# Patient Record
Sex: Female | Born: 1995 | Race: White | Hispanic: No | Marital: Married | Smoking: Former smoker
Health system: Southern US, Community
[De-identification: ages and names within clinical notes are randomized; demographics above are authoritative.]

## PROBLEM LIST (undated history)

## (undated) DIAGNOSIS — F410 Panic disorder [episodic paroxysmal anxiety] without agoraphobia: Secondary | ICD-10-CM

## (undated) DIAGNOSIS — F32A Depression, unspecified: Secondary | ICD-10-CM

## (undated) DIAGNOSIS — F419 Anxiety disorder, unspecified: Secondary | ICD-10-CM

## (undated) DIAGNOSIS — E282 Polycystic ovarian syndrome: Secondary | ICD-10-CM

## (undated) DIAGNOSIS — K219 Gastro-esophageal reflux disease without esophagitis: Secondary | ICD-10-CM

## (undated) HISTORY — DX: Panic disorder (episodic paroxysmal anxiety): F41.0

## (undated) HISTORY — PX: TONSILLECTOMY: SUR1361

## (undated) HISTORY — PX: APPENDECTOMY: SHX54

## (undated) HISTORY — DX: Polycystic ovarian syndrome: E28.2

## (undated) HISTORY — PX: ADENOIDECTOMY: SUR15

## (undated) HISTORY — DX: Gastro-esophageal reflux disease without esophagitis: K21.9

## (undated) HISTORY — DX: Depression, unspecified: F32.A

---
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
## 2017-02-10 ENCOUNTER — Ambulatory Visit: Payer: Medicaid Other | Admitting: Pediatrics

## 2017-02-23 ENCOUNTER — Other Ambulatory Visit: Payer: Self-pay | Admitting: Pediatrics

## 2017-02-23 ENCOUNTER — Other Ambulatory Visit: Payer: Self-pay | Admitting: Family Medicine

## 2017-02-23 ENCOUNTER — Ambulatory Visit (INDEPENDENT_AMBULATORY_CARE_PROVIDER_SITE_OTHER): Payer: Self-pay | Admitting: Pediatrics

## 2017-02-23 VITALS — BP 131/84 | HR 89 | Temp 98.0°F | Ht 62.01 in | Wt 225.0 lb

## 2017-02-23 DIAGNOSIS — L680 Hirsutism: Secondary | ICD-10-CM

## 2017-02-23 DIAGNOSIS — Z3009 Encounter for other general counseling and advice on contraception: Secondary | ICD-10-CM

## 2017-02-23 DIAGNOSIS — F32A Depression, unspecified: Secondary | ICD-10-CM

## 2017-02-23 DIAGNOSIS — F329 Major depressive disorder, single episode, unspecified: Secondary | ICD-10-CM

## 2017-02-23 DIAGNOSIS — J309 Allergic rhinitis, unspecified: Secondary | ICD-10-CM

## 2017-02-23 DIAGNOSIS — F419 Anxiety disorder, unspecified: Secondary | ICD-10-CM

## 2017-02-23 DIAGNOSIS — J4530 Mild persistent asthma, uncomplicated: Secondary | ICD-10-CM

## 2017-02-23 LAB — PREGNANCY, URINE: PREG TEST UR: NEGATIVE

## 2017-02-23 MED ORDER — FLUOXETINE HCL 20 MG PO CAPS: 20.0000 mg | ORAL_CAPSULE | Freq: Every day | ORAL | 2 refills | Status: DC

## 2017-02-23 MED ORDER — NORGESTIMATE-ETH ESTRADIOL 0.25-35 MG-MCG PO TABS: 1.0000 | ORAL_TABLET | Freq: Every day | ORAL | 3 refills | Status: DC

## 2017-02-23 NOTE — Patient Instructions (Addendum)
[facility]  [address] [place], [place] [address] (ph) [phone]  [address], [address] [place], [place] [address] (ph) [phone]  
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
## 2017-03-18 ENCOUNTER — Ambulatory Visit: Payer: Medicaid Other | Admitting: Pediatrics

## 2017-03-19 ENCOUNTER — Telehealth: Payer: Self-pay | Admitting: Pediatrics

## 2017-03-19 ENCOUNTER — Encounter: Payer: Self-pay | Admitting: Pediatrics

## 2017-05-18 ENCOUNTER — Encounter: Payer: Self-pay | Admitting: Pediatrics

## 2017-05-18 ENCOUNTER — Ambulatory Visit (INDEPENDENT_AMBULATORY_CARE_PROVIDER_SITE_OTHER): Payer: Self-pay | Admitting: Pediatrics

## 2017-05-18 DIAGNOSIS — L680 Hirsutism: Secondary | ICD-10-CM

## 2017-05-18 DIAGNOSIS — F329 Major depressive disorder, single episode, unspecified: Secondary | ICD-10-CM

## 2017-05-18 DIAGNOSIS — Z3009 Encounter for other general counseling and advice on contraception: Secondary | ICD-10-CM

## 2017-05-18 DIAGNOSIS — F32A Depression, unspecified: Secondary | ICD-10-CM

## 2017-05-18 DIAGNOSIS — F419 Anxiety disorder, unspecified: Secondary | ICD-10-CM

## 2017-05-18 DIAGNOSIS — J302 Other seasonal allergic rhinitis: Secondary | ICD-10-CM

## 2017-05-18 DIAGNOSIS — J4530 Mild persistent asthma, uncomplicated: Secondary | ICD-10-CM

## 2017-05-18 MED ORDER — CETIRIZINE HCL 10 MG PO TABS: 10.0000 mg | ORAL_TABLET | Freq: Every day | ORAL | 5 refills | Status: DC

## 2017-05-18 MED ORDER — SPIRONOLACTONE 50 MG PO TABS: 50.0000 mg | ORAL_TABLET | Freq: Two times a day (BID) | ORAL | 5 refills | Status: DC

## 2017-05-18 MED ORDER — ALBUTEROL SULFATE (5 MG/ML) 0.5% IN NEBU: 2.5000 mg | INHALATION_SOLUTION | Freq: Four times a day (QID) | RESPIRATORY_TRACT | 2 refills | Status: DC | PRN

## 2017-05-18 MED ORDER — FLUTICASONE PROPIONATE 50 MCG/ACT NA SUSP: 2.0000 | Freq: Every day | NASAL | 2 refills | Status: DC

## 2017-05-18 MED ORDER — FLUOXETINE HCL 40 MG PO CAPS: 40.0000 mg | ORAL_CAPSULE | Freq: Every day | ORAL | 5 refills | Status: DC

## 2017-05-18 MED ORDER — NORGESTIMATE-ETH ESTRADIOL 0.25-35 MG-MCG PO TABS: 1.0000 | ORAL_TABLET | Freq: Every day | ORAL | 3 refills | Status: DC

## 2017-05-18 NOTE — Patient Instructions (Signed)
Your provider wants you to schedule an appointment with a Psychologist/Psychiatrist. The following list of offices requires the patient to call and make their own appointment, as there is information they need that only you can provide. Please feel free to choose form the following providers:  [facility]   [phone] Crisis Recovery in [place] [phone]  [facility]  [phone]   [address] [place], [place]  (Scheduled through [facility]) Must call and do an interview for appointment. Sees Children / Accepts Medicaid  [facility]    [phone]  [address], [address]    [place], [place]       [facility]  [phone] [address] [place], [place]  Evaluates for Autism but does not treat it Sees Children / Accepts Medicaid  [facility]    [phone] [address], [address]   [place], [place] Medication management, substance abuse, bipolar, grief, family, marriage, OCD, anxiety, PTSD Sees children / Accepts Medicaid  [facility]    [phone] [address] [place], [place] Sees children / Accepts Medicaid  [facility]  [phone] [address] [place], [place]   Dr Akinlayo     [phone] [address] [place], [place]  Sees ADD & ADHD for treatment Accepts Medicaid  [facility]  [phone] [address] [place], [place] Evaluates for Autism Accepts Medicaid   [facility]   [phone] [address]   [place], [place] Uses animal therapy  Sees children as young as 3 years old Accepts Medicaid  [facility]     [phone]    [address]  [place], [place] [address] Sees children Accepts Medicaid   
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
## 2017-05-19 LAB — BMP8+EGFR
BUN / CREAT RATIO: 14 (ref 9–23)
BUN: 9 mg/dL (ref 6–20)
CO2: 22 mmol/L (ref 20–29)
CREATININE: 0.63 mg/dL (ref 0.57–1.00)
Calcium: 9 mg/dL (ref 8.7–10.2)
Chloride: 104 mmol/L (ref 96–106)
GFR, EST AFRICAN AMERICAN: 150 mL/min/{1.73_m2} (ref >59)
GFR, EST NON AFRICAN AMERICAN: 130 mL/min/{1.73_m2} (ref >59)
Glucose: 101 mg/dL — ABNORMAL HIGH (ref 65–99)
POTASSIUM: 4.1 mmol/L (ref 3.5–5.2)
SODIUM: 143 mmol/L (ref 134–144)

## 2017-07-06 ENCOUNTER — Encounter (HOSPITAL_COMMUNITY): Payer: Self-pay | Admitting: *Deleted

## 2017-07-06 ENCOUNTER — Emergency Department (HOSPITAL_COMMUNITY)
Admission: EM | Admit: 2017-07-06 | Discharge: 2017-07-06 | Disposition: A | Discharge status: Home / Self Care | Payer: Self-pay | Attending: Emergency Medicine | Admitting: Emergency Medicine

## 2017-07-06 DIAGNOSIS — J45909 Unspecified asthma, uncomplicated: Secondary | ICD-10-CM | POA: Insufficient documentation

## 2017-07-06 DIAGNOSIS — Z79899 Other long term (current) drug therapy: Secondary | ICD-10-CM | POA: Insufficient documentation

## 2017-07-06 DIAGNOSIS — F17210 Nicotine dependence, cigarettes, uncomplicated: Secondary | ICD-10-CM | POA: Insufficient documentation

## 2017-07-06 DIAGNOSIS — R110 Nausea: Secondary | ICD-10-CM | POA: Insufficient documentation

## 2017-07-06 HISTORY — DX: Anxiety disorder, unspecified: F41.9

## 2017-07-06 LAB — CBC
HCT: 38.9 % (ref 36.0–46.0)
HEMOGLOBIN: 12.7 g/dL (ref 12.0–15.0)
MCH: 28.5 pg (ref 26.0–34.0)
MCHC: 32.6 g/dL (ref 30.0–36.0)
MCV: 87.4 fL (ref 78.0–100.0)
Platelets: 340 10*3/uL (ref 150–400)
RBC: 4.45 MIL/uL (ref 3.87–5.11)
RDW: 13.4 % (ref 11.5–15.5)
WBC: 10.7 10*3/uL — ABNORMAL HIGH (ref 4.0–10.5)

## 2017-07-06 LAB — CBG MONITORING, ED: Glucose-Capillary: 125 mg/dL — ABNORMAL HIGH (ref 65–99)

## 2017-07-06 LAB — BASIC METABOLIC PANEL
Anion gap: 8 (ref 5–15)
BUN: 8 mg/dL (ref 6–20)
CHLORIDE: 103 mmol/L (ref 101–111)
CO2: 25 mmol/L (ref 22–32)
CREATININE: 0.7 mg/dL (ref 0.44–1.00)
Calcium: 8.6 mg/dL — ABNORMAL LOW (ref 8.9–10.3)
GFR calc Af Amer: >60 mL/min (ref >60)
GFR calc non Af Amer: >60 mL/min (ref >60)
GLUCOSE: 132 mg/dL — AB (ref 65–99)
Potassium: 3.2 mmol/L — ABNORMAL LOW (ref 3.5–5.1)
SODIUM: 136 mmol/L (ref 135–145)

## 2017-07-06 LAB — PREGNANCY, URINE: PREG TEST UR: NEGATIVE

## 2017-07-06 NOTE — ED Provider Notes (Signed)
[facility] EMERGENCY DEPARTMENT Provider Note   CSN: [id number] Arrival date & time: 07/06/17  4098     History   Chief Complaint Chief Complaint  Patient presents with  . Nausea    HPI Zoe Miller is a 21 y.o. female.  HPI For evaluation of episodes of feeling nauseated and sweaty and feeling like her blood sugar was low.  Patient does not have diabetes but family members do.  She checked her blood sugar and it was 69.  She had a milkshake and her sugar increased but then she felt like it dropped again.  She denies any abdominal pain.  No vomiting or diarrhea.  No fevers or chills.  Patient is not having the symptoms right now. Past Medical History:  Diagnosis Date  . Anxiety   . Asthma     Patient Active Problem List   Diagnosis Date Noted  . Depression 11/05/2016  . Anxiety 11/05/2016  . Elevated blood pressure reading 06/19/2016  . Birth control counseling 06/19/2016  . Anxiety and depression 05/21/2016  . Lymphadenopathy 08/17/2015  . BMI 40.0-44.9, adult (HCC) 08/17/2015  . Asthma, mild persistent 08/17/2015  . Rhinitis, allergic 08/17/2015  . Hirsutism 08/17/2015  . PCOS (polycystic ovarian syndrome) 08/17/2015    Past Surgical History:  Procedure Laterality Date  . APPENDECTOMY    . TONSILLECTOMY      OB History    No data available       Home Medications    Prior to Admission medications   Medication Sig Start Date End Date Taking? Authorizing Provider  albuterol (PROVENTIL) (5 MG/ML) 0.5% nebulizer solution Take 0.5 mLs (2.5 mg total) by nebulization every 6 (six) hours as needed. 05/18/17  Yes Johna Sheriff, MD  cetirizine (ZYRTEC) 10 MG tablet Take 1 tablet (10 mg total) by mouth daily. 05/18/17  Yes Johna Sheriff, MD  FLUoxetine (PROZAC) 20 MG capsule Take 20 mg by mouth daily.   Yes [provider]  norgestimate-ethinyl estradiol (ORTHO-CYCLEN,SPRINTEC,PREVIFEM) 0.25-35 MG-MCG tablet Take 1 tablet by mouth daily. 05/18/17   Yes Johna Sheriff, MD    Family History Family History  Problem Relation Age of Onset  . Diabetes Mother     Social History Social History  Substance Use Topics  . Smoking status: Current Every Day Smoker    Packs/day: 0.50    Types: Cigarettes  . Smokeless tobacco: Never Used  . Alcohol use No     Allergies   Lactose intolerance (gi) and Chocolate   Review of Systems Review of Systems  All other systems reviewed and are negative.    Physical Exam Updated Vital Signs BP 134/81   Pulse 83   Temp 97.6 F (36.4 C) (Oral)   Resp 16   Ht 1.575 m (5\' 2" )   Wt 103.6 kg (228 lb 6.4 oz)   LMP  (Within Weeks)   SpO2 100%   BMI 41.77 kg/m   Physical Exam  Constitutional: She appears well-developed and well-nourished. No distress.  HENT:  Head: Normocephalic and atraumatic.  Right Ear: External ear normal.  Left Ear: External ear normal.  Eyes: Conjunctivae are normal. Right eye exhibits no discharge. Left eye exhibits no discharge. No scleral icterus.  Neck: Neck supple. No tracheal deviation present.  Cardiovascular: Normal rate, regular rhythm and intact distal pulses.   Pulmonary/Chest: Effort normal and breath sounds normal. No stridor. No respiratory distress. She has no wheezes. She has no rales.  Abdominal: Soft. Bowel sounds are  normal. She exhibits no distension. There is no tenderness. There is no rebound and no guarding.  Musculoskeletal: She exhibits no edema or tenderness.  Neurological: She is alert. She has normal strength. No cranial nerve deficit (no facial droop, extraocular movements intact, no slurred speech) or sensory deficit. She exhibits normal muscle tone. She displays no seizure activity. Coordination normal.  Skin: Skin is warm and dry. No rash noted.  Psychiatric: She has a normal mood and affect.  Nursing note and vitals reviewed.    ED Treatments / Results  Labs (all labs ordered are listed, but only abnormal results are  displayed) Labs Reviewed  CBC - Abnormal; Notable for the following:       Result Value   WBC 10.7 (*)    All other components within normal limits  BASIC METABOLIC PANEL - Abnormal; Notable for the following:    Potassium 3.2 (*)    Glucose, Bld 132 (*)    Calcium 8.6 (*)    All other components within normal limits  CBG MONITORING, ED - Abnormal; Notable for the following:    Glucose-Capillary 125 (*)    All other components within normal limits  PREGNANCY, URINE     Radiology No results found.  Procedures Procedures (including critical care time)  Medications Ordered in ED Medications - No data to display   Initial Impression / Assessment and Plan / ED Course  I have reviewed the triage vital signs and the nursing notes.  Pertinent labs & imaging results that were available during my care of the patient were reviewed by me and considered in my medical decision making (see chart for details).   Patient's labs are reassuring.  Mild hypokalemia noted but I do not think this is clinically significant.  It is possible her symptoms may have been related to borderline blood sugars.  She may have had recurrent symptoms from drinking a milkshake with high sugar content and subsequent insulin release.  At this time there does not appear to be any evidence of an acute emergency medical condition and the patient appears stable for discharge with appropriate outpatient follow up.   Final Clinical Impressions(s) / ED Diagnoses   Final diagnoses:  Nausea    New Prescriptions New Prescriptions   No medications on file     Linwood DibblesKnapp, Yamna Mackel, MD 07/06/17 1114

## 2017-07-06 NOTE — Discharge Instructions (Signed)
Make sure to eat a balanced diet as we discussed.  Follow-up with your primary care doctor if you continue to have symptoms.

## 2017-07-06 NOTE — ED Triage Notes (Signed)
Pt c/o nausea, cold sweats, low blood sugar, increased anxiety since last night. Pt reports she isn't a diabetic but checked her sugar last night after feeling "bad" and her sugar was 69. Pt reported she ate a peanut butter sandwich and her sugar increased to 146. Pt reports she takes Prozac and has been taking it as prescribed. Denies abdominal pain, vomiting, diarrhea.

## 2017-08-03 ENCOUNTER — Emergency Department (HOSPITAL_COMMUNITY)
Admission: EM | Admit: 2017-08-03 | Discharge: 2017-08-04 | Disposition: A | Discharge status: Home / Self Care | Payer: Medicaid Other | Attending: Emergency Medicine | Admitting: Emergency Medicine

## 2017-08-03 ENCOUNTER — Encounter (HOSPITAL_COMMUNITY): Payer: Self-pay | Admitting: *Deleted

## 2017-08-03 ENCOUNTER — Other Ambulatory Visit: Payer: Self-pay

## 2017-08-03 DIAGNOSIS — Z79899 Other long term (current) drug therapy: Secondary | ICD-10-CM | POA: Insufficient documentation

## 2017-08-03 DIAGNOSIS — E162 Hypoglycemia, unspecified: Secondary | ICD-10-CM | POA: Insufficient documentation

## 2017-08-03 DIAGNOSIS — J4530 Mild persistent asthma, uncomplicated: Secondary | ICD-10-CM | POA: Insufficient documentation

## 2017-08-03 DIAGNOSIS — R110 Nausea: Secondary | ICD-10-CM

## 2017-08-03 DIAGNOSIS — F17210 Nicotine dependence, cigarettes, uncomplicated: Secondary | ICD-10-CM | POA: Insufficient documentation

## 2017-08-03 LAB — CBC WITH DIFFERENTIAL/PLATELET
Basophils Absolute: 0.1 10*3/uL (ref 0.0–0.1)
Basophils Relative: 1 %
EOS PCT: 2 %
Eosinophils Absolute: 0.3 10*3/uL (ref 0.0–0.7)
HCT: 41.6 % (ref 36.0–46.0)
Hemoglobin: 13.5 g/dL (ref 12.0–15.0)
LYMPHS ABS: 3.4 10*3/uL (ref 0.7–4.0)
LYMPHS PCT: 31 %
MCH: 28.9 pg (ref 26.0–34.0)
MCHC: 32.5 g/dL (ref 30.0–36.0)
MCV: 89.1 fL (ref 78.0–100.0)
MONO ABS: 0.7 10*3/uL (ref 0.1–1.0)
MONOS PCT: 7 %
Neutro Abs: 6.5 10*3/uL (ref 1.7–7.7)
Neutrophils Relative %: 59 %
PLATELETS: 416 10*3/uL — AB (ref 150–400)
RBC: 4.67 MIL/uL (ref 3.87–5.11)
RDW: 13.5 % (ref 11.5–15.5)
WBC: 11 10*3/uL — AB (ref 4.0–10.5)

## 2017-08-03 LAB — URINALYSIS, ROUTINE W REFLEX MICROSCOPIC
Bilirubin Urine: NEGATIVE
GLUCOSE, UA: NEGATIVE mg/dL
Hgb urine dipstick: NEGATIVE
Ketones, ur: NEGATIVE mg/dL
LEUKOCYTES UA: NEGATIVE
Nitrite: NEGATIVE
PH: 5 (ref 5.0–8.0)
Protein, ur: NEGATIVE mg/dL
Specific Gravity, Urine: 1.015 (ref 1.005–1.030)

## 2017-08-03 LAB — BASIC METABOLIC PANEL
Anion gap: 8 (ref 5–15)
BUN: 10 mg/dL (ref 6–20)
CO2: 25 mmol/L (ref 22–32)
Calcium: 9.1 mg/dL (ref 8.9–10.3)
Chloride: 103 mmol/L (ref 101–111)
Creatinine, Ser: 0.74 mg/dL (ref 0.44–1.00)
GFR calc Af Amer: >60 mL/min (ref >60)
GLUCOSE: 110 mg/dL — AB (ref 65–99)
POTASSIUM: 3.5 mmol/L (ref 3.5–5.1)
Sodium: 136 mmol/L (ref 135–145)

## 2017-08-03 LAB — POC URINE PREG, ED: Preg Test, Ur: NEGATIVE

## 2017-08-03 LAB — CBG MONITORING, ED: Glucose-Capillary: 111 mg/dL — ABNORMAL HIGH (ref 65–99)

## 2017-08-03 NOTE — ED Triage Notes (Signed)
Pt reports her blood sugar this morning was 58 and this afternoon it was 78. Pt felt really weak and was sleepy so she checked her blood sugar. Pt concerned she may be diabetic.

## 2017-08-04 ENCOUNTER — Ambulatory Visit: Payer: Self-pay | Admitting: Family Medicine

## 2017-08-04 ENCOUNTER — Ambulatory Visit: Payer: Self-pay | Admitting: Nurse Practitioner

## 2017-08-04 NOTE — ED Notes (Signed)
Pt alert & oriented x4, stable gait. Patient given discharge instructions, paperwork & prescription(s). Patient  instructed to stop at the registration desk to finish any additional paperwork. Patient verbalized understanding. Pt left department w/ no further questions. 

## 2017-08-04 NOTE — ED Provider Notes (Signed)
[facility] EMERGENCY DEPARTMENT Provider Note   CSN: [id number] Arrival date & time: 08/03/17  2047     History   Chief Complaint Chief Complaint  Patient presents with  . Hypoglycemia    HPI Zoe Miller is a 21 y.o. female.   Hypoglycemia  Severity:  Mild Onset quality:  Gradual Timing:  Intermittent Progression:  Waxing and waning Chronicity:  New Diabetic status:  Non-diabetic Context: decreased oral intake   Relieved by:  Eating Ineffective treatments:  None tried Associated symptoms: dizziness and weakness   Associated symptoms: no shortness of breath and no vomiting   Dizziness:    Severity:  Mild   Timing:  Intermittent   Progression:  Waxing and waning patient presents for concern for diabetes Patient reports her blood sugar has been fluctuating for the past several days Reports it has been low as 58 and as high as 100's She Is not diabetic She has not had any new medications Reports when her blood sugar is low she feels lightheaded She denies fever/vomiting Does report recent cough  Past Medical History:  Diagnosis Date  . Anxiety   . Asthma     Patient Active Problem List   Diagnosis Date Noted  . Depression 11/05/2016  . Anxiety 11/05/2016  . Elevated blood pressure reading 06/19/2016  . Birth control counseling 06/19/2016  . Anxiety and depression 05/21/2016  . Lymphadenopathy 08/17/2015  . BMI 40.0-44.9, adult (HCC) 08/17/2015  . Asthma, mild persistent 08/17/2015  . Rhinitis, allergic 08/17/2015  . Hirsutism 08/17/2015  . PCOS (polycystic ovarian syndrome) 08/17/2015    Past Surgical History:  Procedure Laterality Date  . APPENDECTOMY    . TONSILLECTOMY      OB History    No data available       Home Medications    Prior to Admission medications   Medication Sig Start Date End Date Taking? Authorizing Provider  albuterol (PROVENTIL) (5 MG/ML) 0.5% nebulizer solution Take 0.5 mLs (2.5 mg total) by nebulization every  6 (six) hours as needed. 05/18/17  Yes Johna SheriffVincent, Carol L, MD  cetirizine (ZYRTEC) 10 MG tablet Take 1 tablet (10 mg total) by mouth daily. Patient not taking: Reported on 08/03/2017 05/18/17   Johna SheriffVincent, Carol L, MD  FLUoxetine (PROZAC) 20 MG capsule Take 20 mg by mouth daily.    [provider]  norgestimate-ethinyl estradiol (ORTHO-CYCLEN,SPRINTEC,PREVIFEM) 0.25-35 MG-MCG tablet Take 1 tablet by mouth daily. Patient not taking: Reported on 08/03/2017 05/18/17   Johna SheriffVincent, Carol L, MD    Family History Family History  Problem Relation Age of Onset  . Diabetes Mother     Social History Social History   Tobacco Use  . Smoking status: Current Every Day Smoker    Packs/day: 0.50    Types: Cigarettes  . Smokeless tobacco: Never Used  Substance Use Topics  . Alcohol use: No  . Drug use: No     Allergies   Lactose intolerance (gi) and Chocolate   Review of Systems Review of Systems  Constitutional: Negative for fever.  Respiratory: Negative for shortness of breath.   Gastrointestinal: Negative for vomiting.  Neurological: Positive for dizziness and weakness.  All other systems reviewed and are negative.    Physical Exam Updated Vital Signs BP 129/90 (BP Location: Left Arm)   Pulse 97   Temp 98.3 F (36.8 C) (Oral)   Resp 16   Ht 1.575 m (5\' 2" )   Wt 103.4 kg (228 lb)   LMP 07/08/2017  SpO2 100%   BMI 41.70 kg/m   Physical Exam CONSTITUTIONAL: Well developed/well nourished HEAD: Normocephalic/atraumatic EYES: EOMI/PERRL ENMT: Mucous membranes moist NECK: supple no meningeal signs SPINE/BACK:entire spine nontender CV: S1/S2 noted, no murmurs/rubs/gallops noted LUNGS: Lungs are clear to auscultation bilaterally, no apparent distress ABDOMEN: soft, nontender, no rebound or guarding, bowel sounds noted throughout abdomen GU:no cva tenderness NEURO: Pt is awake/alert/appropriate, moves all extremitiesx4.  No facial droop.   EXTREMITIES: pulses normal/equal,  full ROM SKIN: warm, color normal PSYCH: no abnormalities of mood noted, alert and oriented to situation   ED Treatments / Results  Labs (all labs ordered are listed, but only abnormal results are displayed) Labs Reviewed  CBC WITH DIFFERENTIAL/PLATELET - Abnormal; Notable for the following components:      Result Value   WBC 11.0 (*)    Platelets 416 (*)    All other components within normal limits  BASIC METABOLIC PANEL - Abnormal; Notable for the following components:   Glucose, Bld 110 (*)    All other components within normal limits  URINALYSIS, ROUTINE W REFLEX MICROSCOPIC - Abnormal; Notable for the following components:   APPearance HAZY (*)    All other components within normal limits  CBG MONITORING, ED - Abnormal; Notable for the following components:   Glucose-Capillary 111 (*)    All other components within normal limits  POC URINE PREG, ED    EKG  EKG Interpretation None       Radiology No results found.  Procedures Procedures (including critical care time)  Medications Ordered in ED Medications - No data to display   Initial Impression / Assessment and Plan / ED Course  I have reviewed the triage vital signs and the nursing notes.  Pertinent labs  results that were available during my care of the patient were reviewed by me and considered in my medical decision making (see chart for details).     Well-appearing patient, labs reassuring Reassured patient that this is not diabetes Advised to eat well balanced diet Advised to keep check of her blood sugar at home Advised to follow-up with PCP in 1 week   Final Clinical Impressions(s) / ED Diagnoses   Final diagnoses:  Nausea  Hypoglycemia    ED Discharge Orders    None       Zadie RhineWickline, Ravenna Legore, MD 08/04/17 0011

## 2017-08-05 ENCOUNTER — Encounter: Payer: Self-pay | Admitting: Pediatrics

## 2017-10-03 ENCOUNTER — Emergency Department (HOSPITAL_COMMUNITY)
Admission: EM | Admit: 2017-10-03 | Discharge: 2017-10-03 | Disposition: A | Discharge status: Home / Self Care | Payer: Self-pay | Attending: Emergency Medicine | Admitting: Emergency Medicine

## 2017-10-03 ENCOUNTER — Other Ambulatory Visit: Payer: Self-pay

## 2017-10-03 ENCOUNTER — Encounter (HOSPITAL_COMMUNITY): Payer: Self-pay | Admitting: Emergency Medicine

## 2017-10-03 DIAGNOSIS — Z79899 Other long term (current) drug therapy: Secondary | ICD-10-CM | POA: Insufficient documentation

## 2017-10-03 DIAGNOSIS — Z87891 Personal history of nicotine dependence: Secondary | ICD-10-CM | POA: Insufficient documentation

## 2017-10-03 DIAGNOSIS — Z793 Long term (current) use of hormonal contraceptives: Secondary | ICD-10-CM | POA: Insufficient documentation

## 2017-10-03 DIAGNOSIS — R42 Dizziness and giddiness: Secondary | ICD-10-CM

## 2017-10-03 DIAGNOSIS — J45909 Unspecified asthma, uncomplicated: Secondary | ICD-10-CM | POA: Insufficient documentation

## 2017-10-03 DIAGNOSIS — F419 Anxiety disorder, unspecified: Secondary | ICD-10-CM | POA: Insufficient documentation

## 2017-10-03 DIAGNOSIS — R002 Palpitations: Secondary | ICD-10-CM | POA: Insufficient documentation

## 2017-10-03 LAB — I-STAT CHEM 8, ED
BUN: 8 mg/dL (ref 6–20)
CALCIUM ION: 1.18 mmol/L (ref 1.15–1.40)
Chloride: 101 mmol/L (ref 101–111)
Creatinine, Ser: 0.8 mg/dL (ref 0.44–1.00)
GLUCOSE: 91 mg/dL (ref 65–99)
HCT: 36 % (ref 36.0–46.0)
HEMOGLOBIN: 12.2 g/dL (ref 12.0–15.0)
Potassium: 3.6 mmol/L (ref 3.5–5.1)
Sodium: 142 mmol/L (ref 135–145)
TCO2: 28 mmol/L (ref 22–32)

## 2017-10-03 LAB — I-STAT BETA HCG BLOOD, ED (MC, WL, AP ONLY)

## 2017-10-03 NOTE — ED Notes (Signed)
Pt ambulatory to waiting room. Pt verbalized understanding of discharge instructions.   

## 2017-10-03 NOTE — ED Triage Notes (Signed)
Pt states after "horseplaying with my boyfriend I began to feel dizzy and felt my heart beat faster." Pt states he BP was high at home.

## 2017-10-03 NOTE — ED Provider Notes (Signed)
[facility] Provider Note   CSN: [id number] Arrival date & time: 10/03/17  0215     History   Chief Complaint Chief Complaint  Patient presents with  . Dizziness    HPI Blaine HamperMiranda J Thomas is a 22 y.o. female.  Patient presents to the ER with concerns over a dizzy spell that occurred earlier tonight.  She reports that she was "horse playing" with her boyfriend earlier tonight when she started to feel like her heart was beating faster.  She became dizzy.  Her mother took her blood pressure which was 159 systolic and her heart rate was 124.  She did not pass out.  There was no associated chest pain.  Patient is not short of breath.  Her symptoms have slowly resolved and she is back at her baseline.      Past Medical History:  Diagnosis Date  . Anxiety   . Asthma     Patient Active Problem List   Diagnosis Date Noted  . Depression 11/05/2016  . Anxiety 11/05/2016  . Elevated blood pressure reading 06/19/2016  . Birth control counseling 06/19/2016  . Anxiety and depression 05/21/2016  . Lymphadenopathy 08/17/2015  . BMI 40.0-44.9, adult (HCC) 08/17/2015  . Asthma, mild persistent 08/17/2015  . Rhinitis, allergic 08/17/2015  . Hirsutism 08/17/2015  . PCOS (polycystic ovarian syndrome) 08/17/2015    Past Surgical History:  Procedure Laterality Date  . APPENDECTOMY    . TONSILLECTOMY      OB History    No data available       Home Medications    Prior to Admission medications   Medication Sig Start Date End Date Taking? Authorizing Provider  albuterol (PROVENTIL) (5 MG/ML) 0.5% nebulizer solution Take 0.5 mLs (2.5 mg total) by nebulization every 6 (six) hours as needed. 05/18/17   Johna SheriffVincent, Carol L, MD  cetirizine (ZYRTEC) 10 MG tablet Take 1 tablet (10 mg total) by mouth daily. Patient not taking: Reported on 08/03/2017 05/18/17   Johna SheriffVincent, Carol L, MD  FLUoxetine (PROZAC) 20 MG capsule Take 20 mg by mouth daily.    [provider]    norgestimate-ethinyl estradiol (ORTHO-CYCLEN,SPRINTEC,PREVIFEM) 0.25-35 MG-MCG tablet Take 1 tablet by mouth daily. Patient not taking: Reported on 08/03/2017 05/18/17   Johna SheriffVincent, Carol L, MD    Family History Family History  Problem Relation Age of Onset  . Diabetes Mother     Social History Social History   Tobacco Use  . Smoking status: Former Smoker    Packs/day: 0.50    Types: Cigarettes    Last attempt to quit: 10/02/2017  . Smokeless tobacco: Never Used  Substance Use Topics  . Alcohol use: No  . Drug use: No     Allergies   Lactose intolerance (gi) and Chocolate   Review of Systems Review of Systems  Cardiovascular: Positive for palpitations.  Neurological: Positive for dizziness.  All other systems reviewed and are negative.    Physical Exam Updated Vital Signs BP 124/77 (BP Location: Left Arm)   Pulse 95   Temp 97.9 F (36.6 C) (Oral)   Resp 15   Ht 5\' 2"  (1.575 m)   Wt 99.8 kg (220 lb)   LMP 09/12/2017 (Approximate)   SpO2 99%   BMI 40.24 kg/m   Physical Exam  Constitutional: She is oriented to person, place, and time. She appears well-developed and well-nourished. No distress.  HENT:  Head: Normocephalic and atraumatic.  Right Ear: Hearing normal.  Left Ear: Hearing normal.  Nose: Nose normal.  Mouth/Throat: Oropharynx is clear and moist and mucous membranes are normal.  Eyes: Conjunctivae and EOM are normal. Pupils are equal, round, and reactive to light.  Neck: Normal range of motion. Neck supple.  Cardiovascular: Regular rhythm, S1 normal and S2 normal. Exam reveals no gallop and no friction rub.  No murmur heard. Pulmonary/Chest: Effort normal and breath sounds normal. No respiratory distress. She exhibits no tenderness.  Abdominal: Soft. Normal appearance and bowel sounds are normal. There is no hepatosplenomegaly. There is no tenderness. There is no rebound, no guarding, no tenderness at McBurney's point and negative Murphy's sign. No  hernia.  Musculoskeletal: Normal range of motion.  Neurological: She is alert and oriented to person, place, and time. She has normal strength. No cranial nerve deficit or sensory deficit. Coordination normal. GCS eye subscore is 4. GCS verbal subscore is 5. GCS motor subscore is 6.  Skin: Skin is warm, dry and intact. No rash noted. No cyanosis.  Psychiatric: She has a normal mood and affect. Her speech is normal and behavior is normal. Thought content normal.  Nursing note and vitals reviewed.    ED Treatments / Results  Labs (all labs ordered are listed, but only abnormal results are displayed) Labs Reviewed  I-STAT CHEM 8, ED  I-STAT BETA HCG BLOOD, ED ([facility], [facility], [facility] ONLY)    EKG  EKG Interpretation None       Radiology No results found.  Procedures Procedures (including critical care time)  Medications Ordered in ED Medications - No data to display   Initial Impression / Assessment and Plan / ED Course  I have reviewed the triage vital signs and the nursing notes.  Pertinent labs & imaging results that were available during my care of the patient were reviewed by me and considered in my medical decision making (see chart for details).     Patient presents with concerns over an episode of dizziness with racing heartbeat after physical exertion during horseplay.  There was no injury.  Patient did not pass out.  Patient has normal neurologic function.  Her vital signs are normal here in the ER.  EKG is normal.  Patient is on a monitor, no arrhythmia noted.  No evidence of anemia or pregnancy.  Patient reassured, no further workup or intervention necessary.  Final Clinical Impressions(s) / ED Diagnoses   Final diagnoses:  Dizziness  Palpitations    ED Discharge Orders    None       Aquanetta Schwarz, Canary Brim, MD 10/03/17 [phone]

## 2017-10-07 ENCOUNTER — Telehealth: Payer: Self-pay | Admitting: Pediatrics

## 2017-10-07 NOTE — Telephone Encounter (Signed)
Lipozene is a health supplement, not a medication, so not as regulated by the FDA. Has fiber in it. I don't think it would affect her birth control pills but it may. It hasn't been shown to help with sustained weight loss so I do not generally recommend. She can come in sooner than 6 mo f/u due in 11/2017 to talk about weight if she wants.

## 2017-10-07 NOTE — Telephone Encounter (Signed)
Pt wants to talk to you about taking Lipozene but her mother thinks it could be dangerous with her BC pills.

## 2017-10-07 NOTE — Telephone Encounter (Signed)
lmtcb

## 2017-10-21 ENCOUNTER — Ambulatory Visit: Payer: Self-pay | Admitting: Pediatrics

## 2017-10-26 ENCOUNTER — Encounter: Payer: Self-pay | Admitting: Pediatrics

## 2017-11-02 NOTE — Telephone Encounter (Signed)
Several attempts have been made to contact patient this encounter will be closed.  

## 2017-11-05 ENCOUNTER — Ambulatory Visit: Payer: Self-pay | Admitting: Pediatrics

## 2017-11-17 ENCOUNTER — Ambulatory Visit: Payer: Self-pay | Admitting: Pediatrics

## 2017-12-04 ENCOUNTER — Ambulatory Visit: Payer: Self-pay | Admitting: Pediatrics

## 2017-12-09 ENCOUNTER — Encounter: Payer: Self-pay | Admitting: Pediatrics

## 2018-02-05 ENCOUNTER — Encounter (HOSPITAL_COMMUNITY): Payer: Self-pay

## 2018-02-05 ENCOUNTER — Emergency Department (HOSPITAL_COMMUNITY)
Admission: EM | Admit: 2018-02-05 | Discharge: 2018-02-06 | Disposition: A | Discharge status: Home / Self Care | Payer: Medicaid Other | Attending: Emergency Medicine | Admitting: Emergency Medicine

## 2018-02-05 DIAGNOSIS — Z79899 Other long term (current) drug therapy: Secondary | ICD-10-CM | POA: Insufficient documentation

## 2018-02-05 DIAGNOSIS — Z87891 Personal history of nicotine dependence: Secondary | ICD-10-CM | POA: Insufficient documentation

## 2018-02-05 DIAGNOSIS — H60391 Other infective otitis externa, right ear: Secondary | ICD-10-CM | POA: Insufficient documentation

## 2018-02-05 DIAGNOSIS — J45909 Unspecified asthma, uncomplicated: Secondary | ICD-10-CM | POA: Insufficient documentation

## 2018-02-05 NOTE — ED Triage Notes (Signed)
Pt states her right ear started hurting earlier this evening and she noticed some bleeding from same, decreased in hearing. No injury

## 2018-02-06 MED ORDER — OFLOXACIN 0.3 % OP SOLN: OPHTHALMIC | 0 refills | Status: DC

## 2018-02-06 NOTE — Discharge Instructions (Signed)
Thank you for allowing me to provide your care today in the emergency department.  Although the prescription says that the drops are for eyedrops, the antibiotic eyedrops are safe to use in the ear.  Apply 10 drops into the right ear once daily.  To make the drops more effective, lay on your left side after inserting the drops you can gently use a cotton ball to keep the drops in place.  Avoid placing any objects into the ear canal on the right ear.  This may cause trauma to the eardrum.  You can take Tylenol or ibuprofen for pain control.  If your symptoms do not improve in the next week, please follow-up with Dr. Oswaldo DoneVincent.  Return to the emergency department if you develop a high fever despite taking Tylenol or ibuprofen, develop the worst headache of your life, start having double vision or blurred vision, or other new concerning symptoms, please return to the emergency department for re-evaluation.

## 2018-02-06 NOTE — ED Provider Notes (Signed)
[facility] Provider [facility]   CSN: [id number] Arrival date & time: 02/05/18  2313     History   Chief Complaint Chief Complaint  Patient presents with  . Otalgia    HPI Zoe Miller is a 22 y.o. female with a history of hirsutism, PCOS, asthma, and anxiety who presents to the emergency department with a chief complaint of right ear pain and otorrhea.  The patient endorses right otalgia, onset tonight.  She states that she noticed blood draining from the ear this evening.  She states it also sounds like her hearing is muffled in the right ear.  She denies fever, chills, headache, visual changes, URI symptoms, nausea, vomiting, dizziness, lightheadedness, or left otalgia.  She reports that she went swimming in the river over the last few days.  She also reports that she felt like something was stuck in her ear earlier this week so she used a Q-tip to try and remove it.  No other treatment prior to arrival.  The history is provided by the patient. No language interpreter was used.    Past Medical History:  Diagnosis Date  . Anxiety   . Asthma     Patient Active Problem List   Diagnosis Date Noted  . Depression 11/05/2016  . Anxiety 11/05/2016  . Elevated blood pressure reading 06/19/2016  . Birth control counseling 06/19/2016  . Anxiety and depression 05/21/2016  . Lymphadenopathy 08/17/2015  . BMI 40.0-44.9, adult (HCC) 08/17/2015  . Asthma, mild persistent 08/17/2015  . Rhinitis, allergic 08/17/2015  . Hirsutism 08/17/2015  . PCOS (polycystic ovarian syndrome) 08/17/2015    Past Surgical History:  Procedure Laterality Date  . APPENDECTOMY    . TONSILLECTOMY       OB History   None      Home Medications    Prior to Admission medications   Medication Sig Start Date End Date Taking? Authorizing Provider  albuterol (PROVENTIL) (5 MG/ML) 0.5% nebulizer solution Take 0.5 mLs (2.5 mg total) by nebulization every 6 (six) hours as needed.  05/18/17   Johna SheriffVincent, Carol L, MD  cetirizine (ZYRTEC) 10 MG tablet Take 1 tablet (10 mg total) by mouth daily. Patient not taking: Reported on 08/03/2017 05/18/17   Johna SheriffVincent, Carol L, MD  FLUoxetine (PROZAC) 20 MG capsule Take 20 mg by mouth daily.    [provider]  norgestimate-ethinyl estradiol (ORTHO-CYCLEN,SPRINTEC,PREVIFEM) 0.25-35 MG-MCG tablet Take 1 tablet by mouth daily. Patient not taking: Reported on 08/03/2017 05/18/17   Johna SheriffVincent, Carol L, MD  ofloxacin (OCUFLOX) 0.3 % ophthalmic solution Apply 10 drops into the right ear once daily for the next week. 02/06/18   Kerri Kovacik A, PA-C    Family History Family History  Problem Relation Age of Onset  . Diabetes Mother     Social History Social History   Tobacco Use  . Smoking status: Former Smoker    Packs/day: 0.50    Types: Cigarettes    Last attempt to quit: 10/02/2017    Years since quitting: 0.3  . Smokeless tobacco: Never Used  Substance Use Topics  . Alcohol use: No  . Drug use: No     Allergies   Lactose intolerance (gi) and Chocolate   Review of Systems Review of Systems  Constitutional: Negative for activity change, chills and fever.  HENT: Positive for ear discharge and ear pain. Negative for congestion, rhinorrhea, sinus pressure, sinus pain and sore throat.   Eyes: Negative for pain and visual disturbance.  Respiratory: Negative  for shortness of breath.   Cardiovascular: Negative for chest pain.  Gastrointestinal: Negative for abdominal pain, nausea and vomiting.  Musculoskeletal: Negative for back pain.  Skin: Negative for rash.  Neurological: Negative for dizziness and headaches.     Physical Exam Updated Vital Signs BP 115/68 (BP Location: Right Arm)   Pulse 86   Temp 98 F (36.7 C) (Oral)   Resp 17   Ht 5\' 4"  (1.626 m)   Wt 84.8 kg (187 lb)   SpO2 98%   BMI 32.10 kg/m   Physical Exam  Constitutional: No distress.  HENT:  Head: Normocephalic.  Pain with movement of the right  auricle and tragus.  Right posterior lymphadenopathy.  No mastoid tenderness bilaterally.  Left TM and canal are normal.  Right TM is intact.  Right canal is erythematous and edematous.  I do not appreciate any active bleeding or dried blood in the right canal.  Hearing appears to be intact.  Eyes: Conjunctivae are normal.  Neck: Neck supple.  Cardiovascular: Normal rate, regular rhythm and normal heart sounds. Exam reveals no gallop and no friction rub.  No murmur heard. Pulmonary/Chest: Effort normal. No stridor. No respiratory distress. She has no wheezes. She has no rales. She exhibits no tenderness.  Abdominal: Soft. She exhibits no distension.  Neurological: She is alert.  Skin: Skin is warm. No rash noted.  Psychiatric: Her behavior is normal.  Nursing note and vitals reviewed.    ED Treatments / Results  Labs (all labs ordered are listed, but only abnormal results are displayed) Labs Reviewed - No data to display  EKG None  Radiology No results found.  Procedures Procedures (including critical care time)  Medications Ordered in ED Medications - No data to display   Initial Impression / Assessment and Plan / ED Course  I have reviewed the triage vital signs and the nursing notes.  Pertinent labs & imaging results that were available during my care of the patient were reviewed by me and considered in my medical decision making (see chart for details).     23 year old female with a history of PCOS, hirsutism, asthma, and anxiety. Pt presenting with otitis externa after swimming. No canal occlusion, Pt afebrile in NAD. Exam non concerning for mastoiditis, cellulitis or malignant OE. Dc with ofloxacin script.  Advised PCP follow up in 2-3 days if no improvement with treatment or no complete resolution by 7 days. Earlier f-u if child develops rash , allergic reaction to medication, or loss of hearing.  Final Clinical Impressions(s) / ED Diagnoses   Final diagnoses:    Other infective acute otitis externa of right ear    ED Discharge Orders        Ordered    ofloxacin (OCUFLOX) 0.3 % ophthalmic solution     02/06/18 0030       Deziya Amero A, PA-C 02/06/18 0118    Zadie Rhine, MD 02/06/18 [phone]

## 2018-02-21 ENCOUNTER — Other Ambulatory Visit: Payer: Self-pay

## 2018-02-21 ENCOUNTER — Emergency Department (HOSPITAL_COMMUNITY)
Admission: EM | Admit: 2018-02-21 | Discharge: 2018-02-22 | Disposition: A | Discharge status: Home / Self Care | Payer: Medicaid Other | Attending: Emergency Medicine | Admitting: Emergency Medicine

## 2018-02-21 ENCOUNTER — Encounter (HOSPITAL_COMMUNITY): Payer: Self-pay | Admitting: Emergency Medicine

## 2018-02-21 DIAGNOSIS — H6501 Acute serous otitis media, right ear: Secondary | ICD-10-CM | POA: Insufficient documentation

## 2018-02-21 DIAGNOSIS — H8121 Vestibular neuronitis, right ear: Secondary | ICD-10-CM | POA: Insufficient documentation

## 2018-02-21 DIAGNOSIS — Z87891 Personal history of nicotine dependence: Secondary | ICD-10-CM | POA: Insufficient documentation

## 2018-02-21 DIAGNOSIS — Z79899 Other long term (current) drug therapy: Secondary | ICD-10-CM | POA: Insufficient documentation

## 2018-02-21 DIAGNOSIS — J45909 Unspecified asthma, uncomplicated: Secondary | ICD-10-CM | POA: Insufficient documentation

## 2018-02-21 MED ORDER — MECLIZINE HCL 12.5 MG PO TABS
25.0000 mg | ORAL_TABLET | Freq: Once | ORAL | Status: AC
  Administered 2018-02-22: 25 mg via ORAL
  Filled 2018-02-21: qty 2

## 2018-02-21 NOTE — ED Provider Notes (Signed)
[facility] Provider [facility]   CSN: [id number] Arrival date & time: 02/21/18  2333     History   Chief Complaint Chief Complaint  Patient presents with  . Dizziness    HPI Zoe Miller is a 22 y.o. female.  HPI   22 year old female presenting for evaluation of dizziness.  Patient report 2 weeks ago she was having dizziness and right ear pain.  She was seen in the ED for symptom.  She was diagnosed with otitis externa and was prescribed antibiotic eardrops.  She has been using the eardrops as prescribed but report still having intermittent bouts of dizziness in which she described as room spinning sensation worsening with positional change as well as having mild pain to her right ear.  No associated fever, chills, headache, diplopia, ringing in ear, loss of hearing, runny nose sneezing or sore throat.  She denies any drainage or pain when she lays on the affected side.  She is unsure if she is pregnant.  No complaint of abdominal pain vaginal bleeding or vaginal discharge.  Past Medical History:  Diagnosis Date  . Anxiety   . Asthma     Patient Active Problem List   Diagnosis Date Noted  . Depression 11/05/2016  . Anxiety 11/05/2016  . Elevated blood pressure reading 06/19/2016  . Birth control counseling 06/19/2016  . Anxiety and depression 05/21/2016  . Lymphadenopathy 08/17/2015  . BMI 40.0-44.9, adult (HCC) 08/17/2015  . Asthma, mild persistent 08/17/2015  . Rhinitis, allergic 08/17/2015  . Hirsutism 08/17/2015  . PCOS (polycystic ovarian syndrome) 08/17/2015    Past Surgical History:  Procedure Laterality Date  . APPENDECTOMY    . TONSILLECTOMY       OB History   None      Home Medications    Prior to Admission medications   Medication Sig Start Date End Date Taking? Authorizing Provider  albuterol (PROVENTIL) (5 MG/ML) 0.5% nebulizer solution Take 0.5 mLs (2.5 mg total) by nebulization every 6 (six) hours as needed. 05/18/17    Johna SheriffVincent, Carol L, MD  cetirizine (ZYRTEC) 10 MG tablet Take 1 tablet (10 mg total) by mouth daily. Patient not taking: Reported on 08/03/2017 05/18/17   Johna SheriffVincent, Carol L, MD  FLUoxetine (PROZAC) 20 MG capsule Take 20 mg by mouth daily.    [provider]  norgestimate-ethinyl estradiol (ORTHO-CYCLEN,SPRINTEC,PREVIFEM) 0.25-35 MG-MCG tablet Take 1 tablet by mouth daily. Patient not taking: Reported on 08/03/2017 05/18/17   Johna SheriffVincent, Carol L, MD  ofloxacin (OCUFLOX) 0.3 % ophthalmic solution Apply 10 drops into the right ear once daily for the next week. 02/06/18   McDonald, Mia A, PA-C    Family History Family History  Problem Relation Age of Onset  . Diabetes Mother     Social History Social History   Tobacco Use  . Smoking status: Former Smoker    Packs/day: 0.50    Types: Cigarettes    Last attempt to quit: 10/02/2017    Years since quitting: 0.3  . Smokeless tobacco: Never Used  Substance Use Topics  . Alcohol use: No  . Drug use: No     Allergies   Lactose intolerance (gi) and Chocolate   Review of Systems Review of Systems  All other systems reviewed and are negative.    Physical Exam Updated Vital Signs BP 132/87 (BP Location: Right Arm)   Pulse 97   Temp 98.1 F (36.7 C) (Oral)   Resp 16   Ht 5\' 4"  (1.626 m)  Wt 84.8 kg (187 lb)   LMP  (LMP Unknown)   SpO2 100%   BMI 32.10 kg/m   Physical Exam  Constitutional: She is oriented to person, place, and time. She appears well-developed and well-nourished. No distress.  Obese female resting comfortably in no acute discomfort.  HENT:  Head: Atraumatic.  Ears: Right TM is erythematous and mildly bulging.  Left TM is normal.  No tenderness to manipulation of the earlobes. Nose: Normal nares Throat: Uvula midline no tonsillar enlargement or exudates.  Eyes: Conjunctivae are normal.  Neck: Normal range of motion. Neck supple.  No nuchal rigidity  Cardiovascular: Normal rate and regular rhythm.    Pulmonary/Chest: Effort normal and breath sounds normal.  Neurological: She is alert and oriented to person, place, and time. Coordination and gait normal. GCS eye subscore is 4. GCS verbal subscore is 5. GCS motor subscore is 6.  Skin: No rash noted.  Psychiatric: She has a normal mood and affect.  Nursing note and vitals reviewed.    ED Treatments / Results  Labs (all labs ordered are listed, but only abnormal results are displayed) Labs Reviewed - No data to display  EKG None  Radiology No results found.  Procedures Procedures (including critical care time)  Medications Ordered in ED Medications - No data to display   Initial Impression / Assessment and Plan / ED Course  I have reviewed the triage vital signs and the nursing notes.  Pertinent labs & imaging results that were available during my care of the patient were reviewed by me and considered in my medical decision making (see chart for details).     BP 132/87 (BP Location: Right Arm)   Pulse 97   Temp 98.1 F (36.7 C) (Oral)   Resp 16   Ht 5\' 4"  (1.626 m)   Wt 84.8 kg (187 lb)   LMP  (LMP Unknown)   SpO2 100%   BMI 32.10 kg/m    Final Clinical Impressions(s) / ED Diagnoses   Final diagnoses:  Right acute serous otitis media, recurrence not specified  Vestibular neuritis, right    ED Discharge Orders        Ordered    predniSONE (DELTASONE) 20 MG tablet     02/22/18 0027    amoxicillin (AMOXIL) 500 MG capsule  2 times daily     02/22/18 0027    meclizine (ANTIVERT) 25 MG tablet  3 times daily PRN     02/22/18 0027     12:02 AM Patient here with complaints of dizziness suggestive of peripheral vertigo likely secondary to underlying right inner ear infection.  She still has a moderate amount of erythema noted to her right TM but no evidence of perforation.  She has been using ofloxacin eardrop without adequate relief.  She does not have symptoms to suggest otitis externa.  She has no focal  neuro deficit, has normal gait.  We will treat her ear infection with p.o. antibiotic, amoxicillin.  Will prescribe meclizine as needed for dizziness. Suspect vestibular neuritis, will treat with taper course of steroid for 10 days.  Plan to also check pregnancy test.   Fayrene Helper, PA-C 02/22/18 0047    Mesner, Barbara Cower, MD 02/22/18 [id number]

## 2018-02-21 NOTE — ED Triage Notes (Signed)
Patient has complaints of dizziness starting 2-3 days ago, was diagnosed with inner ear last week in [facility].

## 2018-02-22 LAB — POC URINE PREG, ED: Preg Test, Ur: NEGATIVE

## 2018-02-22 MED ORDER — PREDNISONE 10 MG PO TABS
60.0000 mg | ORAL_TABLET | Freq: Once | ORAL | Status: AC
  Administered 2018-02-22: 60 mg via ORAL
  Filled 2018-02-22: qty 1

## 2018-02-22 MED ORDER — MECLIZINE HCL 25 MG PO TABS: 25.0000 mg | ORAL_TABLET | Freq: Three times a day (TID) | ORAL | 0 refills | Status: DC | PRN

## 2018-02-22 MED ORDER — PREDNISONE 20 MG PO TABS: ORAL_TABLET | ORAL | 0 refills | Status: DC

## 2018-02-22 MED ORDER — AMOXICILLIN 500 MG PO CAPS: 500.0000 mg | ORAL_CAPSULE | Freq: Two times a day (BID) | ORAL | 0 refills | Status: DC

## 2018-02-22 NOTE — Discharge Instructions (Addendum)
Your dizziness is likely due to right inner ear infection.  Take antibiotic and steroid as prescribed for the full duration.  Take meclizine as needed for dizziness.  Your pregnancy test is negative.

## 2018-04-03 ENCOUNTER — Emergency Department (HOSPITAL_COMMUNITY)
Admission: EM | Admit: 2018-04-03 | Discharge: 2018-04-03 | Disposition: A | Discharge status: Home / Self Care | Payer: Medicaid Other | Attending: Emergency Medicine | Admitting: Emergency Medicine

## 2018-04-03 ENCOUNTER — Emergency Department (HOSPITAL_COMMUNITY): Payer: Medicaid Other

## 2018-04-03 ENCOUNTER — Other Ambulatory Visit: Payer: Self-pay

## 2018-04-03 ENCOUNTER — Encounter (HOSPITAL_COMMUNITY): Payer: Self-pay | Admitting: Emergency Medicine

## 2018-04-03 DIAGNOSIS — S93401A Sprain of unspecified ligament of right ankle, initial encounter: Secondary | ICD-10-CM

## 2018-04-03 DIAGNOSIS — Y929 Unspecified place or not applicable: Secondary | ICD-10-CM | POA: Insufficient documentation

## 2018-04-03 DIAGNOSIS — J45909 Unspecified asthma, uncomplicated: Secondary | ICD-10-CM | POA: Insufficient documentation

## 2018-04-03 DIAGNOSIS — Y999 Unspecified external cause status: Secondary | ICD-10-CM | POA: Insufficient documentation

## 2018-04-03 DIAGNOSIS — Z79899 Other long term (current) drug therapy: Secondary | ICD-10-CM | POA: Insufficient documentation

## 2018-04-03 DIAGNOSIS — M25579 Pain in unspecified ankle and joints of unspecified foot: Secondary | ICD-10-CM

## 2018-04-03 DIAGNOSIS — F17210 Nicotine dependence, cigarettes, uncomplicated: Secondary | ICD-10-CM | POA: Insufficient documentation

## 2018-04-03 DIAGNOSIS — Y939 Activity, unspecified: Secondary | ICD-10-CM | POA: Insufficient documentation

## 2018-04-03 DIAGNOSIS — X509XXA Other and unspecified overexertion or strenuous movements or postures, initial encounter: Secondary | ICD-10-CM | POA: Insufficient documentation

## 2018-04-03 MED ORDER — IBUPROFEN 800 MG PO TABS
800.0000 mg | ORAL_TABLET | Freq: Three times a day (TID) | ORAL | 0 refills | Status: DC
Start: 2018-04-03 — End: 2019-06-21

## 2018-04-03 NOTE — ED Triage Notes (Signed)
Patient states she fell today twisting her right ankle.

## 2018-04-03 NOTE — Discharge Instructions (Addendum)
Elevate and apply ice packs on and off to your ankle.  You may use crutches as needed for weightbearing.  Call Dr. Harrison's office in 1 week to arrange a follow-up appointment if not improving. °

## 2018-04-06 NOTE — ED Provider Notes (Signed)
[facility] EMERGENCY DEPARTMENT Provider Note   CSN: [id number] Arrival date & time: 04/03/18  1900     History   Chief Complaint Chief Complaint  Patient presents with  . Ankle Pain    HPI Zoe Miller is a 22 y.o. female.  HPI   Zoe Miller is a 22 y.o. female who presents to the Emergency Department complaining of right ankle pain.  She describes a mechanical fall in which she "twisted" her right ankle.  She complains of pain associated with weightbearing.  She has not taken any medications or tried any therapies prior to arrival.  She denies numbness or weakness of the extremity, open wounds, or other injuries.   Past Medical History:  Diagnosis Date  . Anxiety   . Asthma     Patient Active Problem List   Diagnosis Date Noted  . Depression 11/05/2016  . Anxiety 11/05/2016  . Elevated blood pressure reading 06/19/2016  . Birth control counseling 06/19/2016  . Anxiety and depression 05/21/2016  . Lymphadenopathy 08/17/2015  . BMI 40.0-44.9, adult (HCC) 08/17/2015  . Asthma, mild persistent 08/17/2015  . Rhinitis, allergic 08/17/2015  . Hirsutism 08/17/2015  . PCOS (polycystic ovarian syndrome) 08/17/2015    Past Surgical History:  Procedure Laterality Date  . APPENDECTOMY    . TONSILLECTOMY       OB History   None      Home Medications    Prior to Admission medications   Medication Sig Start Date End Date Taking? Authorizing Provider  albuterol (PROVENTIL) (5 MG/ML) 0.5% nebulizer solution Take 0.5 mLs (2.5 mg total) by nebulization every 6 (six) hours as needed. 05/18/17   Johna SheriffVincent, Carol L, MD  amoxicillin (AMOXIL) 500 MG capsule Take 1 capsule (500 mg total) by mouth 2 (two) times daily. 02/22/18   Fayrene Helperran, [place], PA-C  cetirizine (ZYRTEC) 10 MG tablet Take 1 tablet (10 mg total) by mouth daily. Patient not taking: Reported on 08/03/2017 05/18/17   Johna SheriffVincent, Carol L, MD  FLUoxetine (PROZAC) 20 MG capsule Take 20 mg by mouth daily.    [provider]  ibuprofen (ADVIL,MOTRIN) 800 MG tablet Take 1 tablet (800 mg total) by mouth 3 (three) times daily. Take with food 04/03/18   Pepper Kerrick, PA-C  meclizine (ANTIVERT) 25 MG tablet Take 1 tablet (25 mg total) by mouth 3 (three) times daily as needed for dizziness. 02/22/18   Fayrene Helperran, [place], PA-C  norgestimate-ethinyl estradiol (ORTHO-CYCLEN,SPRINTEC,PREVIFEM) 0.25-35 MG-MCG tablet Take 1 tablet by mouth daily. Patient not taking: Reported on 08/03/2017 05/18/17   Johna SheriffVincent, Carol L, MD  ofloxacin (OCUFLOX) 0.3 % ophthalmic solution Apply 10 drops into the right ear once daily for the next week. 02/06/18   McDonald, Mia A, PA-C  predniSONE (DELTASONE) 20 MG tablet 3 Tabs PO Days 1-3, then 2 tabs PO Days 4-6, then 1 tab PO Day 7-9, then Half Tab PO Day 10-12 02/22/18   Fayrene Helperran, [place], PA-C    Family History Family History  Problem Relation Age of Onset  . Diabetes Mother     Social History Social History   Tobacco Use  . Smoking status: Current Every Day Smoker    Packs/day: 0.50    Types: Cigarettes    Last attempt to quit: 10/02/2017    Years since quitting: 0.5  . Smokeless tobacco: Never Used  Substance Use Topics  . Alcohol use: No  . Drug use: No     Allergies   Lactose intolerance (gi) and Chocolate  Review of Systems Review of Systems  Constitutional: Negative for chills and fever.  Musculoskeletal: Positive for arthralgias (Right ankle pain). Negative for back pain, joint swelling and neck pain.  Skin: Negative for color change and wound.  Neurological: Negative for weakness and numbness.  All other systems reviewed and are negative.    Physical Exam Updated Vital Signs BP 107/74 (BP Location: Right Arm)   Pulse 68   Temp 98.1 F (36.7 C) (Oral)   Resp 19   Ht 5\' 2"  (1.575 m)   Wt 85.7 kg (189 lb)   LMP 03/05/2018   SpO2 97%   BMI 34.57 kg/m   Physical Exam  Constitutional: She appears well-developed and well-nourished. No distress.  Patient is  obese  HENT:  Head: Atraumatic.  Cardiovascular: Normal rate, regular rhythm and intact distal pulses.  Pulmonary/Chest: Effort normal and breath sounds normal.  Musculoskeletal: She exhibits tenderness. She exhibits no edema.  Diffuse tenderness to palpation of the lateral right ankle and foot.  No significant edema.  No bony deformity.  No proximal tenderness.  Compartments are soft.  Neurological: She is alert. No sensory deficit.  Skin: Skin is warm. Capillary refill takes less than 2 seconds.  Nursing note and vitals reviewed.    ED Treatments / Results  Labs (all labs ordered are listed, but only abnormal results are displayed) Labs Reviewed - No data to display  EKG None  Radiology  Dg Ankle Complete Right  Result Date: 04/03/2018 CLINICAL DATA:  Acute RIGHT ankle pain following fall today. Initial encounter. EXAM: RIGHT ANKLE - COMPLETE 3+ VIEW COMPARISON:  None. FINDINGS: There is no evidence of fracture, dislocation, or joint effusion. There is no evidence of arthropathy or other focal bone abnormality. Soft tissues are unremarkable. IMPRESSION: Negative. Electronically Signed   By: Harmon Pier M.D.   On: 04/03/2018 19:51   Dg Foot Complete Right  Result Date: 04/03/2018 CLINICAL DATA:  Acute RIGHT foot pain following fall today. Initial encounter. EXAM: RIGHT FOOT COMPLETE - 3+ VIEW COMPARISON:  02/18/2013 FINDINGS: No acute fracture, subluxation or dislocation. The joint spaces are unremarkable. Apparent soft tissue swelling noted. No suspicious bony lesions identified. IMPRESSION: No acute bony abnormality. Electronically Signed   By: Harmon Pier M.D.   On: 04/03/2018 19:53     Procedures Procedures (including critical care time)  Medications Ordered in ED Medications - No data to display   Initial Impression / Assessment and Plan / ED Course  I have reviewed the triage vital signs and the nursing notes.  Pertinent labs & imaging results that were available  during my care of the patient were reviewed by me and considered in my medical decision making (see chart for details).     X-ray results reviewed and discussed with the patient.  Likely sprain.  She agrees to RICE therapy and close orthopedic follow-up in 1 week if not improving.  ASO applied, remains neurovascularly intact.  Final Clinical Impressions(s) / ED Diagnoses   Final diagnoses:  Sprain of right ankle, unspecified ligament, initial encounter    ED Discharge Orders        Ordered    ibuprofen (ADVIL,MOTRIN) 800 MG tablet  3 times daily     04/03/18 [address], [place], PA-C 04/06/18 0142    Margarita Grizzle, MD 04/07/18 [phone]

## 2018-08-17 ENCOUNTER — Other Ambulatory Visit: Payer: Self-pay

## 2018-08-17 ENCOUNTER — Encounter (HOSPITAL_COMMUNITY): Payer: Self-pay | Admitting: Emergency Medicine

## 2018-08-17 ENCOUNTER — Emergency Department (HOSPITAL_COMMUNITY)
Admission: EM | Admit: 2018-08-17 | Discharge: 2018-08-17 | Disposition: A | Discharge status: Home / Self Care | Payer: Medicaid Other | Attending: Emergency Medicine | Admitting: Emergency Medicine

## 2018-08-17 DIAGNOSIS — F17210 Nicotine dependence, cigarettes, uncomplicated: Secondary | ICD-10-CM | POA: Insufficient documentation

## 2018-08-17 DIAGNOSIS — J45909 Unspecified asthma, uncomplicated: Secondary | ICD-10-CM | POA: Insufficient documentation

## 2018-08-17 DIAGNOSIS — K0889 Other specified disorders of teeth and supporting structures: Secondary | ICD-10-CM | POA: Insufficient documentation

## 2018-08-17 DIAGNOSIS — Z79899 Other long term (current) drug therapy: Secondary | ICD-10-CM | POA: Insufficient documentation

## 2018-08-17 MED ORDER — HYDROCODONE-ACETAMINOPHEN 5-325 MG PO TABS
1.0000 | ORAL_TABLET | Freq: Once | ORAL | Status: AC
  Administered 2018-08-17: 1 via ORAL
  Filled 2018-08-17: qty 1

## 2018-08-17 MED ORDER — NAPROXEN 500 MG PO TABS: 500.0000 mg | ORAL_TABLET | Freq: Two times a day (BID) | ORAL | 0 refills | Status: DC

## 2018-08-17 MED ORDER — PENICILLIN V POTASSIUM 500 MG PO TABS: 500.0000 mg | ORAL_TABLET | Freq: Four times a day (QID) | ORAL | 0 refills | Status: AC

## 2018-08-17 MED ORDER — KETOROLAC TROMETHAMINE 30 MG/ML IJ SOLN
30.0000 mg | Freq: Once | INTRAMUSCULAR | Status: AC
  Administered 2018-08-17: 30 mg via INTRAMUSCULAR
  Filled 2018-08-17: qty 1

## 2018-08-17 NOTE — ED Provider Notes (Signed)
[facility] EMERGENCY DEPARTMENT Provider Note   CSN: [id number] Arrival date & time: 08/17/18  0249     History   Chief Complaint Chief Complaint  Patient presents with  . Dental Pain    HPI Zoe Miller is a 22 y.o. female.  HPI  This is a 22 year old female who presents with dental pain.  Patient reports 3-week history of worsening right upper dental pain.  She states that it has progressed and now she is unable to sleep.  She rates her pain 8 out of 10.  She has taken ibuprofen with minimal relief.  She denies any fever or difficulty swallowing.  She has not seen a dentist.  Past Medical History:  Diagnosis Date  . Anxiety   . Asthma     Patient Active Problem List   Diagnosis Date Noted  . Depression 11/05/2016  . Anxiety 11/05/2016  . Elevated blood pressure reading 06/19/2016  . Birth control counseling 06/19/2016  . Anxiety and depression 05/21/2016  . Lymphadenopathy 08/17/2015  . BMI 40.0-44.9, adult (HCC) 08/17/2015  . Asthma, mild persistent 08/17/2015  . Rhinitis, allergic 08/17/2015  . Hirsutism 08/17/2015  . PCOS (polycystic ovarian syndrome) 08/17/2015    Past Surgical History:  Procedure Laterality Date  . APPENDECTOMY    . TONSILLECTOMY       OB History   None      Home Medications    Prior to Admission medications   Medication Sig Start Date End Date Taking? Authorizing Provider  albuterol (PROVENTIL) (5 MG/ML) 0.5% nebulizer solution Take 0.5 mLs (2.5 mg total) by nebulization every 6 (six) hours as needed. 05/18/17   Johna Sheriff, MD  amoxicillin (AMOXIL) 500 MG capsule Take 1 capsule (500 mg total) by mouth 2 (two) times daily. 02/22/18   Fayrene Helper, PA-C  cetirizine (ZYRTEC) 10 MG tablet Take 1 tablet (10 mg total) by mouth daily. Patient not taking: Reported on 08/03/2017 05/18/17   Johna Sheriff, MD  FLUoxetine (PROZAC) 20 MG capsule Take 20 mg by mouth daily.    [provider]  ibuprofen (ADVIL,MOTRIN) 800  MG tablet Take 1 tablet (800 mg total) by mouth 3 (three) times daily. Take with food 04/03/18   Triplett, Tammy, PA-C  meclizine (ANTIVERT) 25 MG tablet Take 1 tablet (25 mg total) by mouth 3 (three) times daily as needed for dizziness. 02/22/18   Fayrene Helper, PA-C  naproxen (NAPROSYN) 500 MG tablet Take 1 tablet (500 mg total) by mouth 2 (two) times daily. 08/17/18   Horton, Mayer Masker, MD  norgestimate-ethinyl estradiol (ORTHO-CYCLEN,SPRINTEC,PREVIFEM) 0.25-35 MG-MCG tablet Take 1 tablet by mouth daily. Patient not taking: Reported on 08/03/2017 05/18/17   Johna Sheriff, MD  ofloxacin (OCUFLOX) 0.3 % ophthalmic solution Apply 10 drops into the right ear once daily for the next week. 02/06/18   McDonald, Mia A, PA-C  penicillin v potassium (VEETID) 500 MG tablet Take 1 tablet (500 mg total) by mouth 4 (four) times daily for 10 days. 08/17/18 08/27/18  Horton, Mayer Masker, MD  predniSONE (DELTASONE) 20 MG tablet 3 Tabs PO Days 1-3, then 2 tabs PO Days 4-6, then 1 tab PO Day 7-9, then Half Tab PO Day 10-12 02/22/18   Fayrene Helper, PA-C    Family History Family History  Problem Relation Age of Onset  . Diabetes Mother     Social History Social History   Tobacco Use  . Smoking status: Current Every Day Smoker    Packs/day: 0.50  Types: Cigarettes    Last attempt to quit: 10/02/2017    Years since quitting: 0.8  . Smokeless tobacco: Never Used  Substance Use Topics  . Alcohol use: No  . Drug use: No     Allergies   Lactose intolerance (gi) and Chocolate   Review of Systems Review of Systems  Constitutional: Negative for fever.  HENT: Positive for dental problem. Negative for trouble swallowing.   All other systems reviewed and are negative.    Physical Exam Updated Vital Signs BP (!) 126/91 (BP Location: Right Arm)   Pulse 91   Temp 98.6 F (37 C) (Oral)   Resp 16   Ht 1.575 m (5\' 2" )   Wt 82.6 kg   LMP 08/07/2018   SpO2 98%   BMI 33.29 kg/m   Physical Exam    Constitutional: She is oriented to person, place, and time. She appears well-developed and well-nourished.  Obese, nontoxic-appearing  HENT:  Head: Normocephalic and atraumatic.  Tenderness to palpation right upper molar along the gumline, no palpable abscess, no trismus, no fullness noted under the tongue  Neck: Normal range of motion. Neck supple.  Cardiovascular: Normal rate and regular rhythm.  Pulmonary/Chest: Effort normal. No respiratory distress.  Neurological: She is alert and oriented to person, place, and time.  Skin: Skin is warm and dry.  Psychiatric: She has a normal mood and affect.  Nursing note and vitals reviewed.    ED Treatments / Results  Labs (all labs ordered are listed, but only abnormal results are displayed) Labs Reviewed - No data to display  EKG None  Radiology No results found.  Procedures Procedures (including critical care time)  Medications Ordered in ED Medications  ketorolac (TORADOL) 30 MG/ML injection 30 mg (30 mg Intramuscular Given 08/17/18 0335)  HYDROcodone-acetaminophen (NORCO/VICODIN) 5-325 MG per tablet 1 tablet (1 tablet Oral Given 08/17/18 0335)     Initial Impression / Assessment and Plan / ED Course  I have reviewed the triage vital signs and the nursing notes.  Pertinent labs & imaging results that were available during my care of the patient were reviewed by me and considered in my medical decision making (see chart for details).     Patient presents with dental pain.  She is overall nontoxic-appearing and vital signs are reassuring.  She has tenderness along the gumline but no obvious abscess to drain.  Suspect underlying infection.  Patient was given pain medication.  Will discharge with naproxen and penicillin.  Additionally, will provide with dental resources.  No signs or symptoms of deep space infection at this time.  After history, exam, and medical workup I feel the patient has been appropriately medically  screened and is safe for discharge home. Pertinent diagnoses were discussed with the patient. Patient was given return precautions.   Final Clinical Impressions(s) / ED Diagnoses   Final diagnoses:  Pain, dental    ED Discharge Orders         Ordered    penicillin v potassium (VEETID) 500 MG tablet  4 times daily     08/17/18 0329    naproxen (NAPROSYN) 500 MG tablet  2 times daily     08/17/18 0329           Horton, Mayer Maskerourtney F, MD 08/17/18 [phone]

## 2018-08-17 NOTE — ED Triage Notes (Signed)
Pt c/o dental pain x 3 weeks

## 2018-09-15 IMAGING — DX DG ANKLE COMPLETE 3+V*R*
3 series · 3 of 3 positions shown · non-contrast
Comparison: None.

CLINICAL DATA: Acute RIGHT ankle pain following fall today. Initial
encounter.

EXAM:
RIGHT ANKLE - COMPLETE 3+ VIEW

[ankle ap]
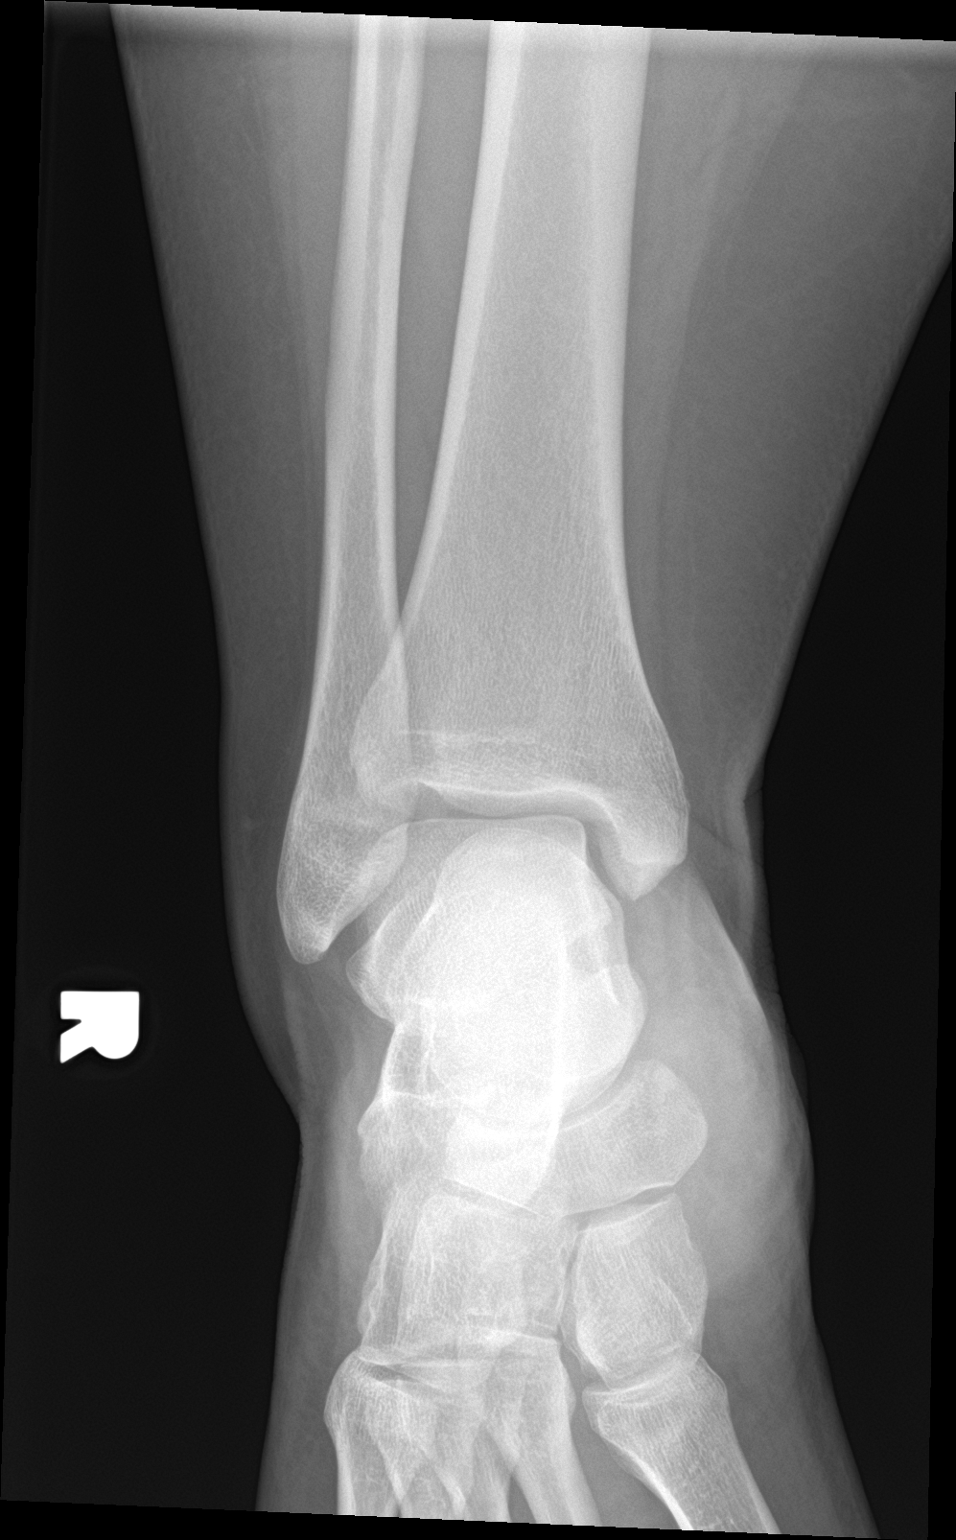

[ankle obl]
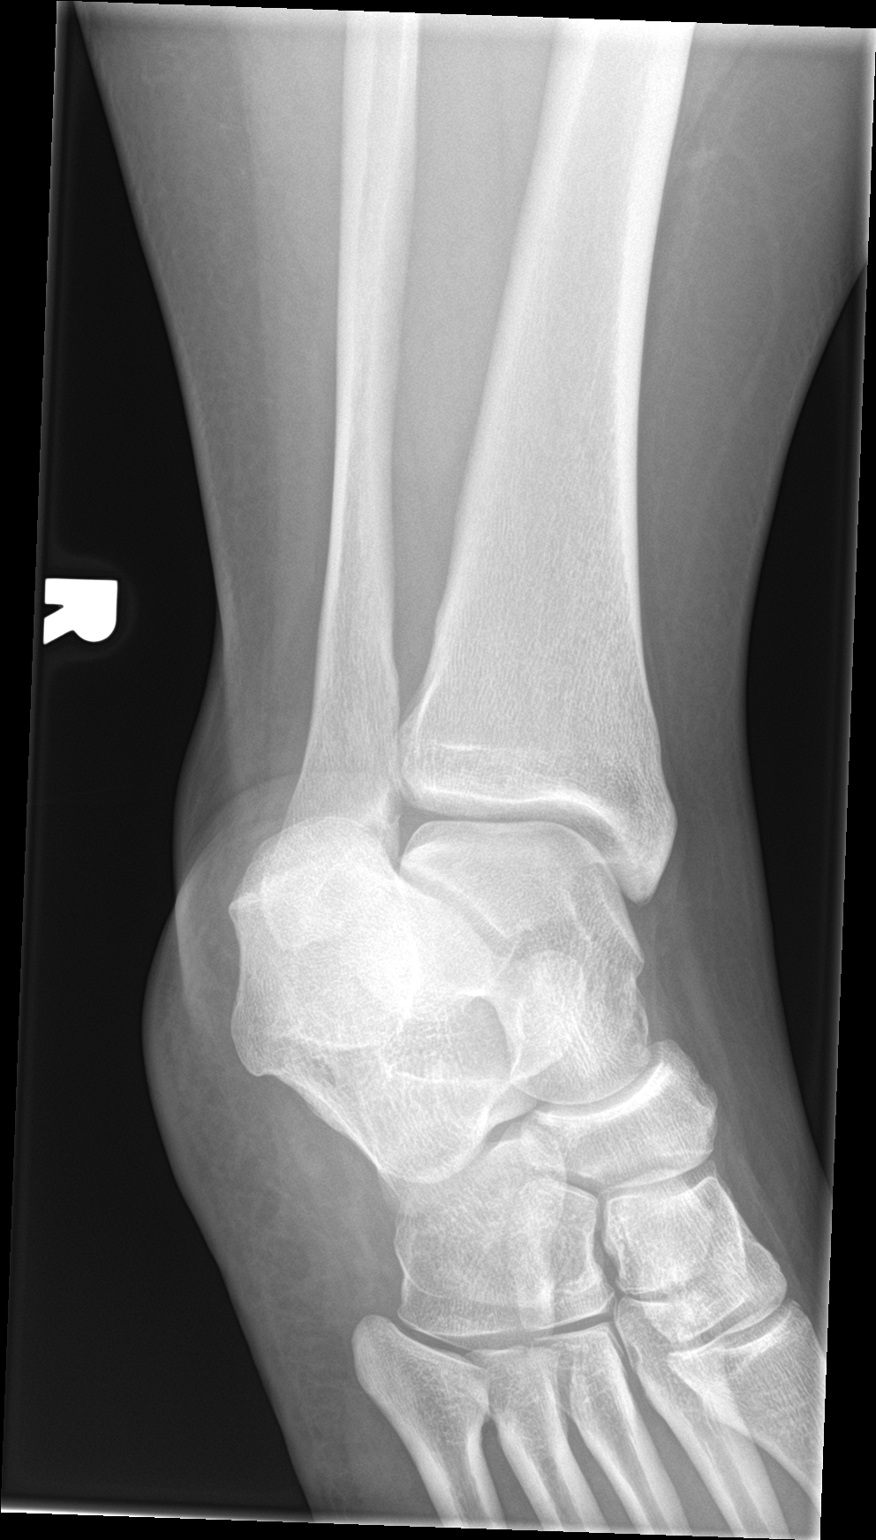

[ankle lat]
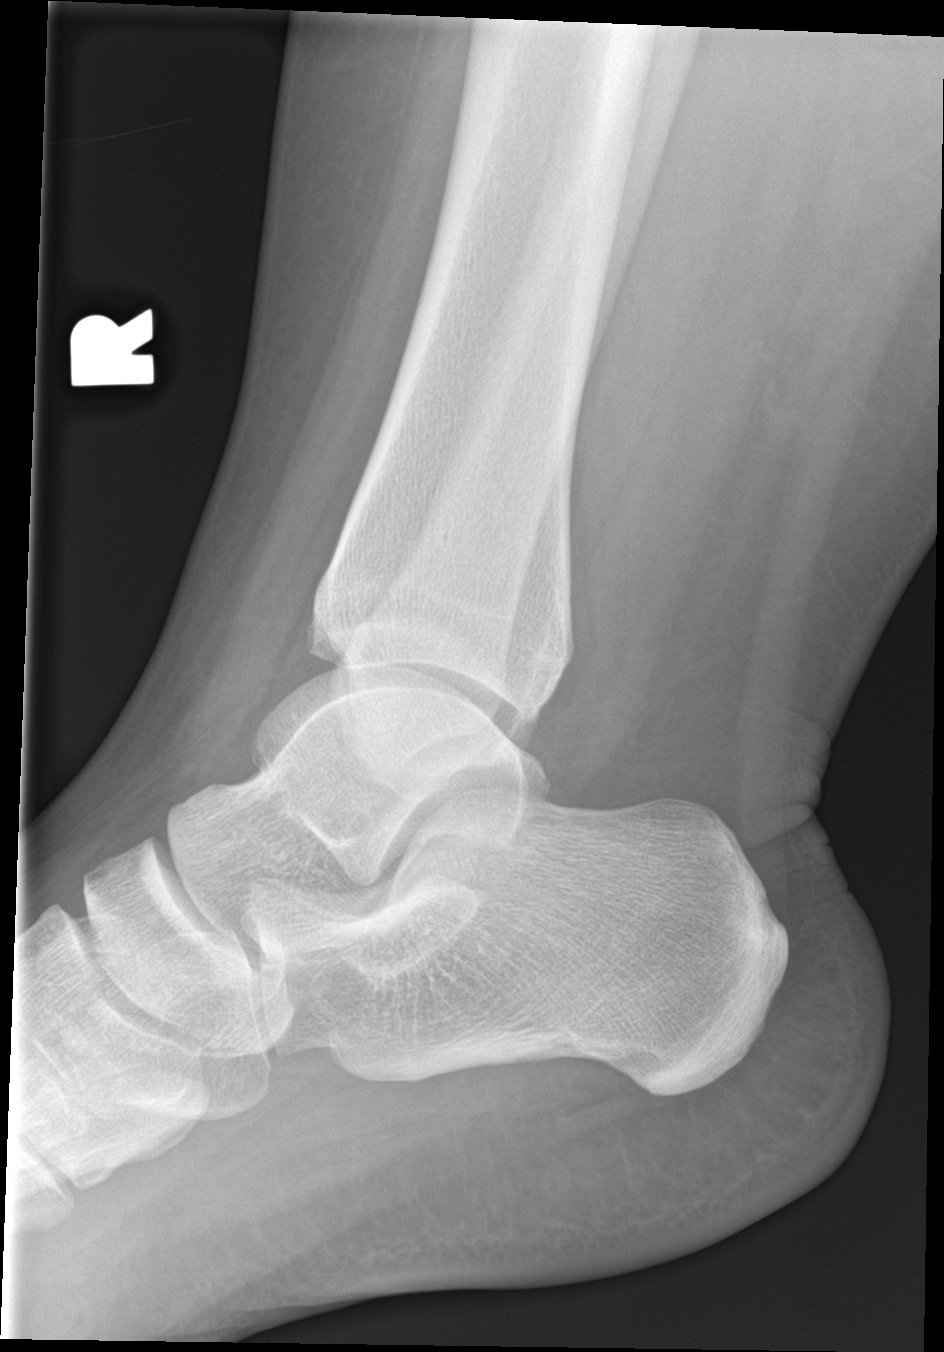

[3 of 3 positions shown; findings below may reference images not displayed]

FINDINGS: There is no evidence of fracture, dislocation, or joint effusion.
There is no evidence of arthropathy or other focal bone abnormality.
Soft tissues are unremarkable.
IMPRESSION: Negative.

## 2018-09-15 IMAGING — DX DG FOOT COMPLETE 3+V*R*
3 series · 3 of 3 positions shown · non-contrast
Comparison: 02/18/2013

CLINICAL DATA: Acute RIGHT foot pain following fall today. Initial
encounter.

EXAM:
RIGHT FOOT COMPLETE - 3+ VIEW

[foot ap]
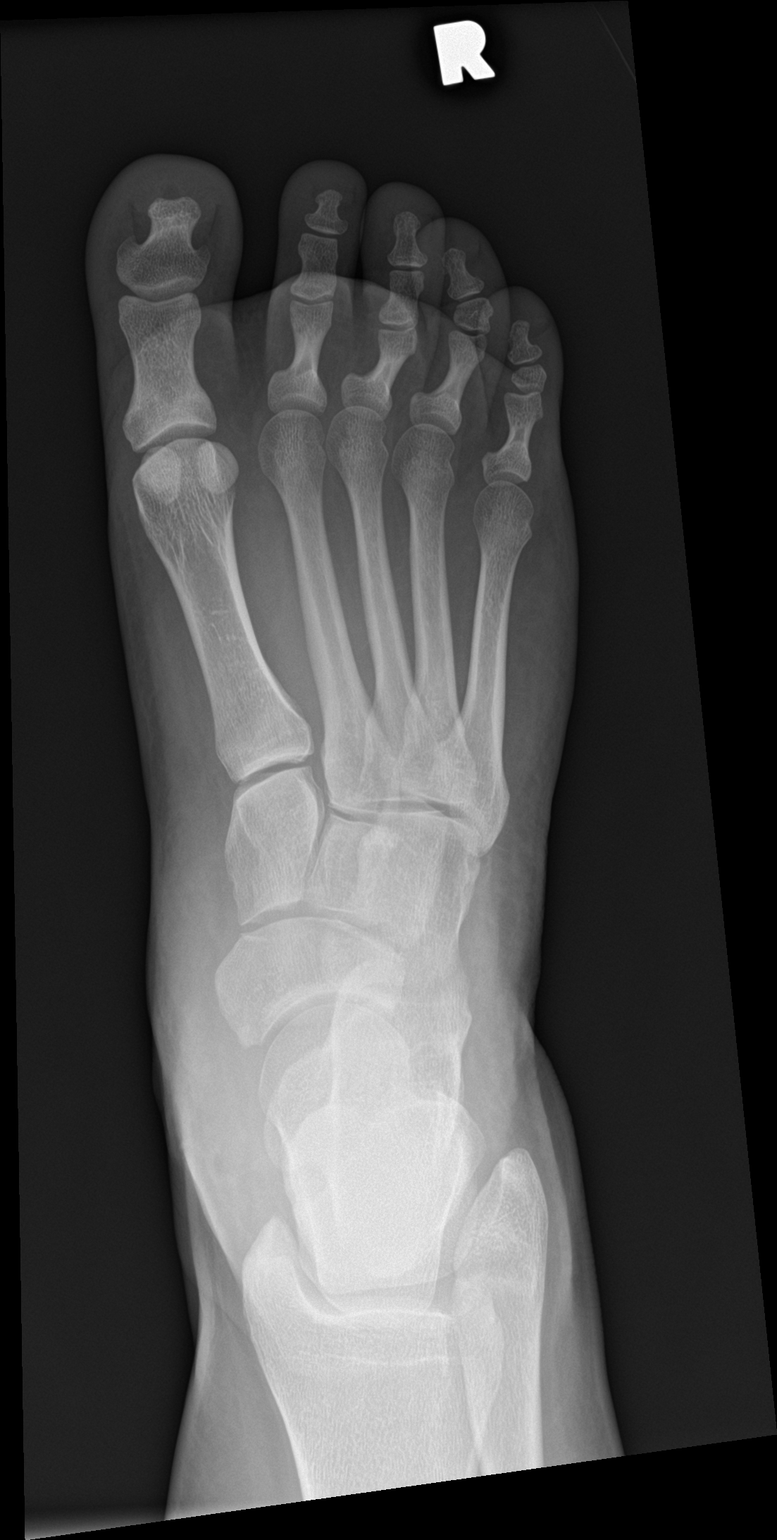

[foot obl]
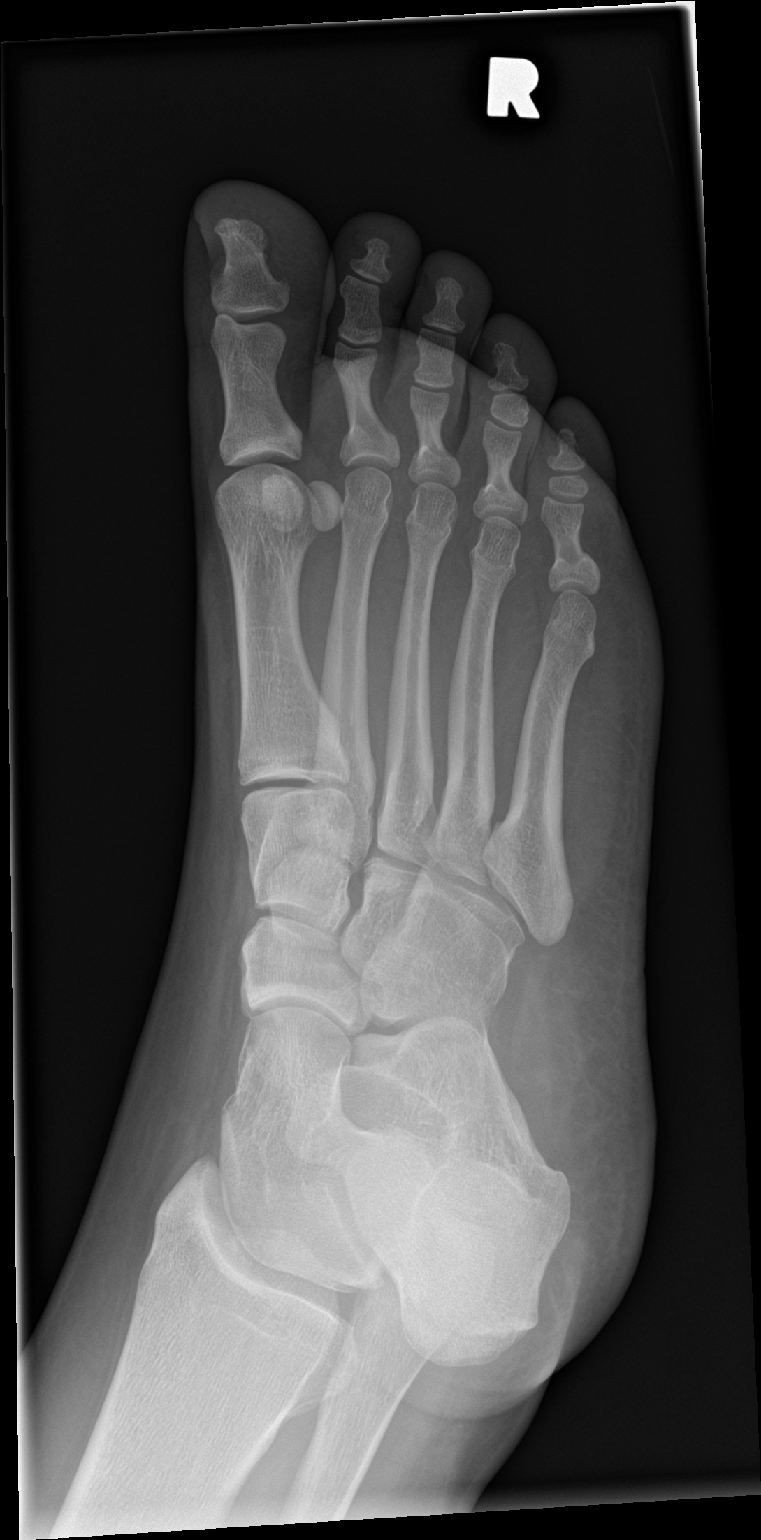

[foot lat]
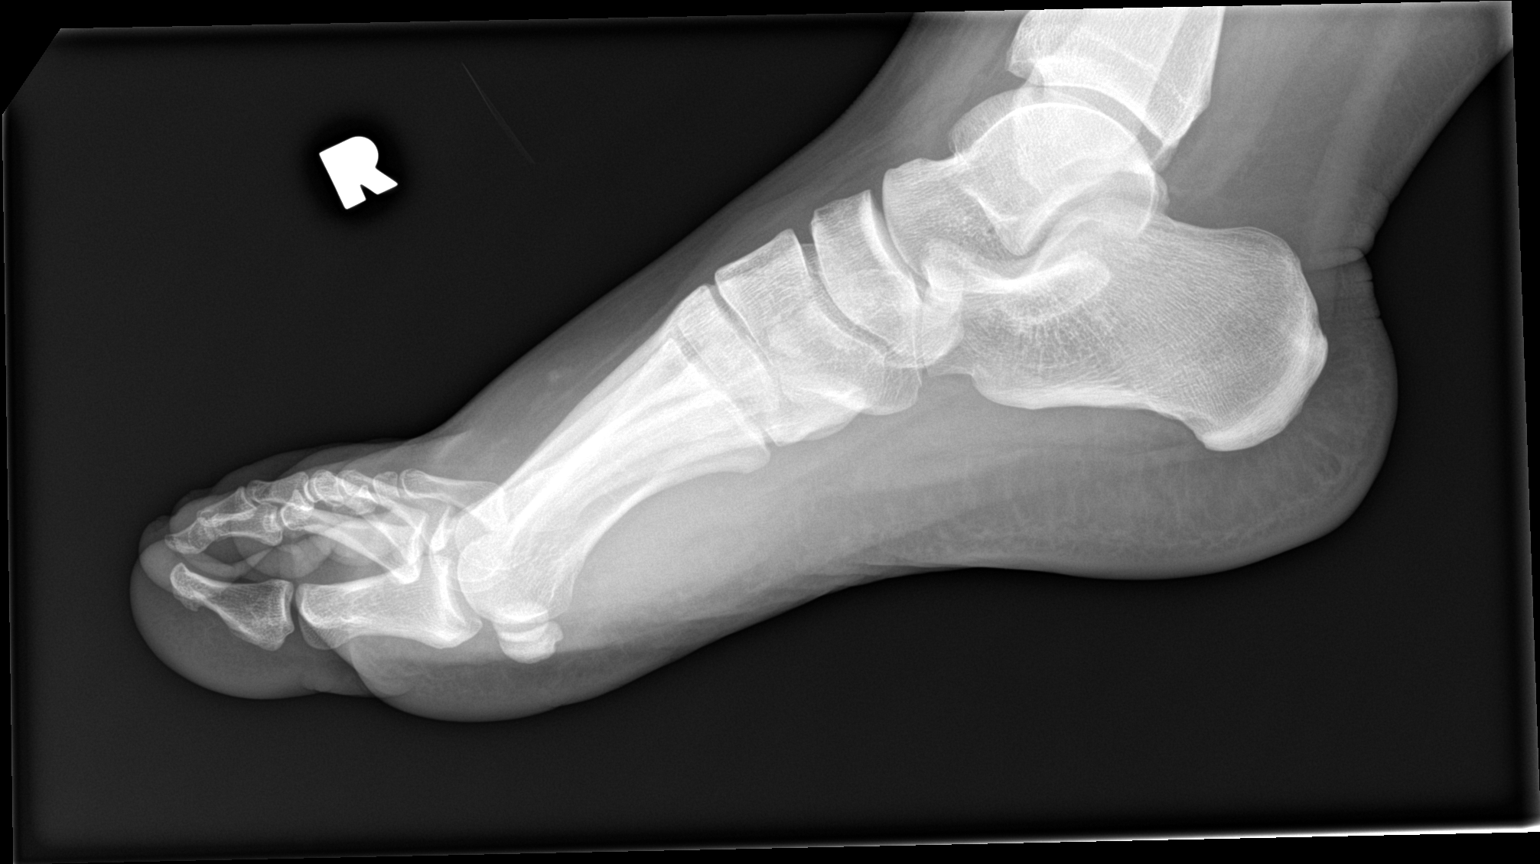

[3 of 3 positions shown; findings below may reference images not displayed]

FINDINGS: No acute fracture, subluxation or dislocation.

The joint spaces are unremarkable.

Apparent soft tissue swelling noted.

No suspicious bony lesions identified.
IMPRESSION: No acute bony abnormality.

## 2018-10-11 ENCOUNTER — Other Ambulatory Visit: Payer: Self-pay | Admitting: Pediatrics

## 2018-10-11 DIAGNOSIS — Z3009 Encounter for other general counseling and advice on contraception: Secondary | ICD-10-CM

## 2018-10-11 DIAGNOSIS — L680 Hirsutism: Secondary | ICD-10-CM

## 2019-06-21 ENCOUNTER — Emergency Department (HOSPITAL_COMMUNITY): Payer: Self-pay

## 2019-06-21 ENCOUNTER — Other Ambulatory Visit: Payer: Self-pay

## 2019-06-21 ENCOUNTER — Encounter (HOSPITAL_COMMUNITY): Payer: Self-pay | Admitting: Emergency Medicine

## 2019-06-21 ENCOUNTER — Emergency Department (HOSPITAL_COMMUNITY)
Admission: EM | Admit: 2019-06-21 | Discharge: 2019-06-21 | Disposition: A | Discharge status: Home / Self Care | Payer: Self-pay | Attending: Emergency Medicine | Admitting: Emergency Medicine

## 2019-06-21 DIAGNOSIS — Y999 Unspecified external cause status: Secondary | ICD-10-CM | POA: Insufficient documentation

## 2019-06-21 DIAGNOSIS — Y929 Unspecified place or not applicable: Secondary | ICD-10-CM | POA: Insufficient documentation

## 2019-06-21 DIAGNOSIS — W19XXXA Unspecified fall, initial encounter: Secondary | ICD-10-CM

## 2019-06-21 DIAGNOSIS — Z3202 Encounter for pregnancy test, result negative: Secondary | ICD-10-CM | POA: Insufficient documentation

## 2019-06-21 DIAGNOSIS — W108XXA Fall (on) (from) other stairs and steps, initial encounter: Secondary | ICD-10-CM | POA: Insufficient documentation

## 2019-06-21 DIAGNOSIS — Z79899 Other long term (current) drug therapy: Secondary | ICD-10-CM | POA: Insufficient documentation

## 2019-06-21 DIAGNOSIS — R0789 Other chest pain: Secondary | ICD-10-CM | POA: Insufficient documentation

## 2019-06-21 DIAGNOSIS — F17210 Nicotine dependence, cigarettes, uncomplicated: Secondary | ICD-10-CM | POA: Insufficient documentation

## 2019-06-21 DIAGNOSIS — J45909 Unspecified asthma, uncomplicated: Secondary | ICD-10-CM | POA: Insufficient documentation

## 2019-06-21 DIAGNOSIS — M25572 Pain in left ankle and joints of left foot: Secondary | ICD-10-CM | POA: Insufficient documentation

## 2019-06-21 DIAGNOSIS — Y939 Activity, unspecified: Secondary | ICD-10-CM | POA: Insufficient documentation

## 2019-06-21 LAB — POCT PREGNANCY, URINE: Preg Test, Ur: NEGATIVE

## 2019-06-21 NOTE — ED Notes (Signed)
Patient transported to X-ray 

## 2019-06-21 NOTE — ED Triage Notes (Signed)
Pt c/o LT ankle pain and swelling after falling [place] steps [place] friends [place] around 1100 this morning.

## 2019-06-21 NOTE — ED Provider Notes (Signed)
[facility] EMERGENCY DEPARTMENT Provider Note   CSN: [id number] Arrival date & time: 06/21/19  1406     History   Chief Complaint Chief Complaint  Patient presents with   Ankle Injury    HPI Zoe Miller is a 23 y.o. female with a hx of tobacco abuse, asthma, & anxiety who presents to the ED s/p fall which occurred @ 11AM. Patient states she was walking down the steps, missed a stair and fell. States she fell with all of her weight on the LLE. Fell down about 5 steps. Denies head injury or LOC. States she is having pain in several locations but primarily to the L ankle pain- states it is severe worse with movement, no alleviating factors, took tylenol PTA but states this has worn off. Also having pain to the back, L ribs, & L foot/lower leg. Denies visual disturbance, numbness, weakness, incontinence, hemoptysis, dyspnea, or abdominal pain.      HPI  Past Medical History:  Diagnosis Date   Anxiety    Asthma     Patient Active Problem List   Diagnosis Date Noted   Depression 11/05/2016   Anxiety 11/05/2016   Elevated blood pressure reading 06/19/2016   Birth control counseling 06/19/2016   Anxiety and depression 05/21/2016   Lymphadenopathy 08/17/2015   BMI 40.0-44.9, adult ([place]) 08/17/2015   Asthma, mild persistent 08/17/2015   Rhinitis, allergic 08/17/2015   Hirsutism 08/17/2015   PCOS (polycystic ovarian syndrome) 08/17/2015    Past Surgical History:  Procedure Laterality Date   APPENDECTOMY     TONSILLECTOMY       OB History   No obstetric history on file.      Home Medications    Prior to Admission medications   Medication Sig Start Date End Date Taking? Authorizing Provider  albuterol (PROVENTIL) (5 MG/ML) 0.5% nebulizer solution Take 0.5 mLs (2.5 mg total) by nebulization every 6 (six) hours as needed. 05/18/17   Eustaquio Maize, MD  amoxicillin (AMOXIL) 500 MG capsule Take 1 capsule (500 mg total) by mouth 2 (two) times daily.  02/22/18   Domenic Moras, PA-C  cetirizine (ZYRTEC) 10 MG tablet Take 1 tablet (10 mg total) by mouth daily. Patient not taking: Reported on 08/03/2017 05/18/17   Eustaquio Maize, MD  FLUoxetine (PROZAC) 20 MG capsule Take 20 mg by mouth daily.    [provider]  ibuprofen (ADVIL,MOTRIN) 800 MG tablet Take 1 tablet (800 mg total) by mouth 3 (three) times daily. Take with food 04/03/18   Triplett, Tammy, PA-C  meclizine (ANTIVERT) 25 MG tablet Take 1 tablet (25 mg total) by mouth 3 (three) times daily as needed for dizziness. 02/22/18   Domenic Moras, PA-C  naproxen (NAPROSYN) 500 MG tablet Take 1 tablet (500 mg total) by mouth 2 (two) times daily. 08/17/18   Horton, Barbette Hair, MD  norgestimate-ethinyl estradiol (ORTHO-CYCLEN,SPRINTEC,PREVIFEM) 0.25-35 MG-MCG tablet Take 1 tablet by mouth daily. Patient not taking: Reported on 08/03/2017 05/18/17   Eustaquio Maize, MD  ofloxacin (OCUFLOX) 0.3 % ophthalmic solution Apply 10 drops into the right ear once daily for the next week. 02/06/18   McDonald, Mia A, PA-C  predniSONE (DELTASONE) 20 MG tablet 3 Tabs PO Days 1-3, then 2 tabs PO Days 4-6, then 1 tab PO Day 7-9, then Half Tab PO Day 10-12 02/22/18   Domenic Moras, PA-C    Family History Family History  Problem Relation Age of Onset   Diabetes Mother     Social History  Social History   Tobacco Use   Smoking status: Current Every Day Smoker    Packs/day: 1.00    Types: Cigarettes   Smokeless tobacco: Never Used  Substance Use Topics   Alcohol use: No   Drug use: No     Allergies   Lactose intolerance (gi) and Chocolate   Review of Systems Review of Systems  Constitutional: Negative for chills and fever.  Eyes: Negative for visual disturbance.  Respiratory: Negative for shortness of breath.   Cardiovascular: Positive for chest pain (l ribs).  Gastrointestinal: Negative for abdominal pain and vomiting.  Musculoskeletal: Positive for arthralgias and back pain.    Neurological: Negative for weakness and numbness.       Negative for incontinence.      Physical Exam Updated Vital Signs BP 125/79 (BP Location: Right Arm)    Pulse 77    Temp 98.2 F (36.8 C) (Oral)    Resp 18    Ht  (1.575 m)    Wt 86.2 kg    SpO2 100%    BMI 34.75 kg/m   Physical Exam Vitals signs and nursing note reviewed.  Constitutional:      General: She is not in acute distress.    Appearance: She is well-developed.  HENT:     Head: Normocephalic and atraumatic. No raccoon eyes or Battle's sign.     Right Ear: No hemotympanum.     Left Ear: No hemotympanum.  Eyes:     General:        Right eye: No discharge.        Left eye: No discharge.     Conjunctiva/sclera: Conjunctivae normal.     Pupils: Pupils are equal, round, and reactive to light.  Neck:     Musculoskeletal: Normal range of motion. No spinous process tenderness.     Comments: No midline tenderness to palpation. Cardiovascular:     Rate and Rhythm: Normal rate and regular rhythm.     Heart sounds: No murmur.     Comments: 2+ symmetric DP pulses. Pulmonary:     Effort: No respiratory distress.     Breath sounds: Normal breath sounds. No wheezing or rales.  Chest:     Chest wall: Tenderness (left lower anterior chest wall w/o overlying skin changse or palpable crepitus) present.  Abdominal:     General: There is no distension.     Palpations: Abdomen is soft.     Tenderness: There is no abdominal tenderness. There is no guarding or rebound.  Musculoskeletal:     Comments: Upper extremities: Intact active range of motion throughout without point/focal bony tenderness Back: Patient diffusely tender throughout the thoracic and lumbar region including midline and bilateral paraspinal muscles.  No point/focal vertebral tenderness.  No palpable step-off Lower extremities: Soft tissue swelling noted to the left ankle.  Patient has intact active range of motion throughout with the exception of left ankle  being limited secondary to pain, able to plantar/dorsiflexion somewhat though.  Patient is tender to palpation to the left fibular head, the left medial/lateral malleolus and ankle ligaments, the midfoot, and the forefoot.  She does have tenderness over the left base of the fifth navicular bone.  Lower extremities are otherwise nontender.  Skin:    General: Skin is warm and dry.     Capillary Refill: Capillary refill takes less than 2 seconds.     Findings: No rash.  Neurological:     Comments: Alert.  Clear speech.  No  focal neurologic deficits.  Sensation grossly intact bilateral lower extremities.  Able to plantar/dorsiflex against resistance bilaterally.  Psychiatric:        Behavior: Behavior normal.    ED Treatments / Results  Labs (all labs ordered are listed, but only abnormal results are displayed) Labs Reviewed  POC URINE PREG, ED  POCT PREGNANCY, URINE    EKG None  Radiology Dg Ankle Complete Left  Result Date: 06/21/2019 CLINICAL DATA:  FALL, LEFT ANKLE PAIN AND SWELLING, PER ER NOTE, Pt c/o LT ankle pain and swelling after falling [place] steps [place] friends house around 1100 this morning. EXAM: LEFT ANKLE COMPLETE - 3+ VIEW COMPARISON:  None. FINDINGS: There is no evidence of fracture, dislocation, or joint effusion. Talar dome is intact. There is no evidence of arthropathy or other focal bone abnormality. Soft tissues are unremarkable. IMPRESSION: Negative left ankle radiographs. Electronically Signed   By: Emmaline Kluver M.D.   On: 06/21/2019 15:16    Procedures Procedures (including critical care time)  Medications Ordered in ED Medications - No data to display   Initial Impression / Assessment and Plan / ED Course  I have reviewed the triage vital signs and the nursing notes.  Pertinent labs & imaging results that were available during my care of the patient were reviewed by me and considered in my medical decision making (see chart for details).    Patient presents to the ED w/ mechanical fall with complaints of pain primarily to the L ankle but also to the back, L ribs, L foot & lower lower leg. Nontoxic appearing, vitals WNL. L ankle xray per triage negative for fx/dislocation- NVI distally. Additional imaging ordered based on patient's locations of tenderness.   POC urine preg negative.  Per nursing/tech staff patient refused additional imaging after she was agreeable initially, she ambulated out of the ER w/o assistance and stated she was not waiting any longer. Upon my arrival to the room patient had unfortunately already left therefore was unable to have formal AMA discussion or provide ASO/crutches or prescription medicines.   She had no signs of serious head/neck injury- do not feel CT imaging of these areas were necessary per [place] CT head/Cspine trauma rules. Patient diffusely tender throughout thoracic/lumbar region without point/focal vertebral tenderness or palpable step off, no focal neuro deficits- suspicion for fx/dislocation fairly low, however did not have her T/L spine x-rays. Chest w/ L sided lower anterior rib tenderness w/o overlying skin changes or palpable crepitus- plan was for rib/chest xray which was not done due to patient leaving- no hypoxia or tachycardia which is reassuring. Abdomen nontender. LLE w/ negative ankle xray, unfortunately did not have L tib/fib/foot xrays however NVI distally & weightbearing.   Should patient have remained in the ED or I been able to speak with her plan w/ negative additional imaging would have been to provide ASO, crutches if needed, & NSAIDs with recommendation for PRICE. Appeared hemodynamically stable while present in the ED.   Final Clinical Impressions(s) / ED Diagnoses   Final diagnoses:  Fall, initial encounter    ED Discharge Orders    None       Desmond Lope 06/21/19 1840    Donnetta Hutching, MD 06/22/19 1549

## 2019-06-29 ENCOUNTER — Other Ambulatory Visit: Payer: Self-pay

## 2019-06-29 DIAGNOSIS — Z20822 Contact with and (suspected) exposure to covid-19: Secondary | ICD-10-CM

## 2019-07-02 LAB — NOVEL CORONAVIRUS, NAA: SARS-CoV-2, NAA: NOT DETECTED

## 2019-07-24 ENCOUNTER — Encounter (HOSPITAL_COMMUNITY): Payer: Self-pay | Admitting: *Deleted

## 2019-07-24 ENCOUNTER — Emergency Department (HOSPITAL_COMMUNITY)
Admission: EM | Admit: 2019-07-24 | Discharge: 2019-07-24 | Disposition: A | Discharge status: Home / Self Care | Payer: Self-pay | Attending: Emergency Medicine | Admitting: Emergency Medicine

## 2019-07-24 ENCOUNTER — Emergency Department (HOSPITAL_COMMUNITY): Payer: Self-pay

## 2019-07-24 ENCOUNTER — Other Ambulatory Visit: Payer: Self-pay

## 2019-07-24 DIAGNOSIS — R002 Palpitations: Secondary | ICD-10-CM | POA: Insufficient documentation

## 2019-07-24 DIAGNOSIS — Z79899 Other long term (current) drug therapy: Secondary | ICD-10-CM | POA: Insufficient documentation

## 2019-07-24 DIAGNOSIS — F419 Anxiety disorder, unspecified: Secondary | ICD-10-CM | POA: Insufficient documentation

## 2019-07-24 DIAGNOSIS — F17210 Nicotine dependence, cigarettes, uncomplicated: Secondary | ICD-10-CM | POA: Insufficient documentation

## 2019-07-24 DIAGNOSIS — J45909 Unspecified asthma, uncomplicated: Secondary | ICD-10-CM | POA: Insufficient documentation

## 2019-07-24 LAB — COMPREHENSIVE METABOLIC PANEL
ALT: 28 U/L (ref 0–44)
AST: 17 U/L (ref 15–41)
Albumin: 4.2 g/dL (ref 3.5–5.0)
Alkaline Phosphatase: 91 U/L (ref 38–126)
Anion gap: 6 (ref 5–15)
BUN: 11 mg/dL (ref 6–20)
CO2: 27 mmol/L (ref 22–32)
Calcium: 9 mg/dL (ref 8.9–10.3)
Chloride: 106 mmol/L (ref 98–111)
Creatinine, Ser: 0.8 mg/dL (ref 0.44–1.00)
GFR calc Af Amer: >60 mL/min (ref >60)
GFR calc non Af Amer: >60 mL/min (ref >60)
Glucose, Bld: 87 mg/dL (ref 70–99)
Potassium: 3.6 mmol/L (ref 3.5–5.1)
Sodium: 139 mmol/L (ref 135–145)
Total Bilirubin: 0.4 mg/dL (ref 0.3–1.2)
Total Protein: 7.5 g/dL (ref 6.5–8.1)

## 2019-07-24 LAB — URINALYSIS, ROUTINE W REFLEX MICROSCOPIC
Bilirubin Urine: NEGATIVE
Glucose, UA: NEGATIVE mg/dL
Hgb urine dipstick: NEGATIVE
Ketones, ur: NEGATIVE mg/dL
Leukocytes,Ua: NEGATIVE
Nitrite: NEGATIVE
Protein, ur: NEGATIVE mg/dL
Specific Gravity, Urine: 1.001 — ABNORMAL LOW (ref 1.005–1.030)
pH: 6 (ref 5.0–8.0)

## 2019-07-24 LAB — CBC WITH DIFFERENTIAL/PLATELET
Abs Immature Granulocytes: 0.04 10*3/uL (ref 0.00–0.07)
Basophils Absolute: 0.1 10*3/uL (ref 0.0–0.1)
Basophils Relative: 1 %
Eosinophils Absolute: 0.1 10*3/uL (ref 0.0–0.5)
Eosinophils Relative: 1 %
HCT: 45.2 % (ref 36.0–46.0)
Hemoglobin: 14.7 g/dL (ref 12.0–15.0)
Immature Granulocytes: 0 %
Lymphocytes Relative: 25 %
Lymphs Abs: 2.9 10*3/uL (ref 0.7–4.0)
MCH: 28.9 pg (ref 26.0–34.0)
MCHC: 32.5 g/dL (ref 30.0–36.0)
MCV: 89 fL (ref 80.0–100.0)
Monocytes Absolute: 0.8 10*3/uL (ref 0.1–1.0)
Monocytes Relative: 7 %
Neutro Abs: 7.9 10*3/uL — ABNORMAL HIGH (ref 1.7–7.7)
Neutrophils Relative %: 66 %
Platelets: 481 10*3/uL — ABNORMAL HIGH (ref 150–400)
RBC: 5.08 MIL/uL (ref 3.87–5.11)
RDW: 12.2 % (ref 11.5–15.5)
WBC: 11.8 10*3/uL — ABNORMAL HIGH (ref 4.0–10.5)
nRBC: 0 % (ref 0.0–0.2)

## 2019-07-24 LAB — WET PREP, GENITAL
Clue Cells Wet Prep HPF POC: NONE SEEN
Sperm: NONE SEEN
Trich, Wet Prep: NONE SEEN
Yeast Wet Prep HPF POC: NONE SEEN

## 2019-07-24 LAB — RAPID URINE DRUG SCREEN, HOSP PERFORMED
Amphetamines: NOT DETECTED
Barbiturates: NOT DETECTED
Benzodiazepines: NOT DETECTED
Cocaine: NOT DETECTED
Opiates: NOT DETECTED
Tetrahydrocannabinol: NOT DETECTED

## 2019-07-24 LAB — D-DIMER, QUANTITATIVE: D-Dimer, Quant: <0.27 ug/mL-FEU (ref 0.00–0.50)

## 2019-07-24 LAB — POC URINE PREG, ED: Preg Test, Ur: NEGATIVE

## 2019-07-24 LAB — TSH: TSH: 1.052 u[IU]/mL (ref 0.350–4.500)

## 2019-07-24 MED ORDER — HYDROXYZINE HCL 25 MG PO TABS: 12.5000 mg | ORAL_TABLET | Freq: Four times a day (QID) | ORAL | 0 refills | Status: DC | PRN

## 2019-07-24 MED ORDER — SODIUM CHLORIDE 0.9 % IV BOLUS
1000.0000 mL | Freq: Once | INTRAVENOUS | Status: AC
  Administered 2019-07-24: 19:00:00 1000 mL via INTRAVENOUS

## 2019-07-24 MED ORDER — LORAZEPAM 2 MG/ML IJ SOLN
0.5000 mg | Freq: Once | INTRAMUSCULAR | Status: AC
  Administered 2019-07-24: 0.5 mg via INTRAVENOUS
  Filled 2019-07-24: qty 1

## 2019-07-24 NOTE — Discharge Instructions (Signed)
You were seen in the emergency department today for palpitations and anxiety.  Your work-up was overall reassuring.  Your labs did not show any substantial abnormalities.  Your chest x-ray was normal.  Your electrolytes were normal.  Your thyroid test was normal.  Your platelets and white blood cells were mildly elevated, do not suspect this is causing your symptoms, please have this rechecked by primary care within 1 to 2 weeks.  We are sending you home with Atarax to take every 6 hours as needed for anxiety/palpitations.  We have prescribed you new medication(s) today. Discuss the medications prescribed today with your pharmacist as they can have adverse effects and interactions with your other medicines including over the counter and prescribed medications. Seek medical evaluation if you start to experience new or abnormal symptoms after taking one of these medicines, seek care immediately if you start to experience difficulty breathing, feeling of your throat closing, facial swelling, or rash as these could be indications of a more serious allergic reaction  Please follow-up with your primary care provider within the next 3 days, he do not have a primary care provider please see list below.  We have also provided counseling resources.  Return to the ER for new or worsening symptoms including but not limited to trouble breathing, chest pain, passing out, thoughts of self-harm, thoughts of hurting others, or any other concerns.

## 2019-07-24 NOTE — ED Provider Notes (Signed)
[facility] EMERGENCY DEPARTMENT Provider Note   CSN: [id number] Arrival date & time: 07/24/19  1802     History   Chief Complaint Chief Complaint  Patient presents with  . Palpitations    HPI Zoe Miller is a 23 y.o. female with a hx of tobacco abuse, asthma, anxiety, depression, & PCOS who presents to the ED with complaints of palpitations that began shortly PTA. Patient states that she had just finished eating dinner when she started to feel somewhat anxious with onset of palpitations which are described as sensation of rapid heart beat. Constant since onset. No alleviating/aggravating factors. Feels a bit lightheaded and stressed with palpitations. She states she quickly reached for her phone with her RUE and had a brief episode of pain to the R chest that she thinks was related to the reach, this is resolved at present. No other chest pain. She has had intermittent similar sxs (palpitations/anxiety/lightheadedness) over the past couple of months since the passing of her father. Has not been evaluated for this. She feels she needs something for anxiety right now. On further question she mentions dysuria after intercourse which is not a new problem for her. Denies dyspnea, syncope,  leg pain/swelling, hemoptysis, recent surgery/trauma, recent long travel, hormone use, personal hx of cancer, or hx of DVT/PE. She drinks a lot of caffeine with mountain dew beverages. Denies drug use.         HPI  Past Medical History:  Diagnosis Date  . Anxiety   . Asthma     Patient Active Problem List   Diagnosis Date Noted  . Depression 11/05/2016  . Anxiety 11/05/2016  . Elevated blood pressure reading 06/19/2016  . Birth control counseling 06/19/2016  . Anxiety and depression 05/21/2016  . Lymphadenopathy 08/17/2015  . BMI 40.0-44.9, adult (HCC) 08/17/2015  . Asthma, mild persistent 08/17/2015  . Rhinitis, allergic 08/17/2015  . Hirsutism 08/17/2015  . PCOS (polycystic ovarian  syndrome) 08/17/2015    Past Surgical History:  Procedure Laterality Date  . APPENDECTOMY    . TONSILLECTOMY       OB History   No obstetric history on file.      Home Medications    Prior to Admission medications   Medication Sig Start Date End Date Taking? Authorizing Provider  acetaminophen (TYLENOL) 500 MG tablet Take 500-1,000 mg by mouth every 6 (six) hours as needed for mild pain or moderate pain.    [provider]    Family History Family History  Problem Relation Age of Onset  . Diabetes Mother     Social History Social History   Tobacco Use  . Smoking status: Current Every Day Smoker    Packs/day: 1.00    Types: Cigarettes  . Smokeless tobacco: Never Used  Substance Use Topics  . Alcohol use: No  . Drug use: No     Allergies   Lactose intolerance (gi) and Chocolate   Review of Systems Review of Systems  Constitutional: Negative for chills, diaphoresis and fever.  HENT: Negative for congestion, ear pain and sore throat.   Eyes: Negative for visual disturbance.  Respiratory: Negative for cough and shortness of breath.   Cardiovascular: Positive for chest pain (brief episode- resolved @ present) and palpitations. Negative for leg swelling.  Gastrointestinal: Negative for abdominal pain, nausea and vomiting.  Genitourinary: Negative for vaginal bleeding and vaginal discharge.  Neurological: Positive for light-headedness. Negative for syncope, weakness, numbness and headaches.  Psychiatric/Behavioral: Negative for suicidal ideas. The patient is nervous/anxious.  All other systems reviewed and are negative.    Physical Exam Updated Vital Signs BP (!) 137/97 (BP Location: Right Arm)   Pulse (!) 135   Temp 98.4 F (36.9 C) (Oral)   Ht 5\' 2"  (1.575 m)   Wt 108.9 kg   SpO2 100%   BMI 43.90 kg/m   Physical Exam Vitals signs and nursing note reviewed. Exam conducted with a chaperone present.  Constitutional:      General: She is not  in acute distress.    Appearance: She is well-developed. She is not toxic-appearing.  HENT:     Head: Normocephalic and atraumatic.  Eyes:     General:        Right eye: No discharge.        Left eye: No discharge.     Conjunctiva/sclera: Conjunctivae normal.  Neck:     Musculoskeletal: Neck supple.  Cardiovascular:     Rate and Rhythm: Regular rhythm. Tachycardia present.     Comments: 2+ symmetric radial & DP pulses bilaterally. HR 115-122 while I am present in exam room  Pulmonary:     Effort: Pulmonary effort is normal. No respiratory distress.     Breath sounds: Normal breath sounds. No wheezing, rhonchi or rales.  Abdominal:     General: There is no distension.     Palpations: Abdomen is soft.     Tenderness: There is no abdominal tenderness. There is no right CVA tenderness, left CVA tenderness, guarding or rebound.  Genitourinary:    Labia:        Right: No tenderness or lesion.        Left: No tenderness or lesion.      Vagina: No vaginal discharge.     Cervix: No cervical motion tenderness or friability.     Adnexa:        Right: No mass or tenderness.         Left: No mass or tenderness.       Comments: EDT present as chaperone.  Musculoskeletal:        General: No tenderness.     Right lower leg: No edema.     Left lower leg: No edema.  Skin:    General: Skin is warm and dry.     Findings: No rash.  Neurological:     Mental Status: She is alert.     Comments: Clear speech.   Psychiatric:        Mood and Affect: Mood is anxious.        Behavior: Behavior normal.        Thought Content: Thought content does not include homicidal or suicidal ideation.     ED Treatments / Results  Labs (all labs ordered are listed, but only abnormal results are displayed) Labs Reviewed  WET PREP, GENITAL  COMPREHENSIVE METABOLIC PANEL  CBC WITH DIFFERENTIAL/PLATELET  TSH  URINALYSIS, ROUTINE W REFLEX MICROSCOPIC  D-DIMER, QUANTITATIVE (NOT AT [facility])  RAPID URINE DRUG  SCREEN, HOSP PERFORMED  RPR  HIV ANTIBODY (ROUTINE TESTING W REFLEX)  POC URINE PREG, ED  GC/CHLAMYDIA PROBE AMP ([place]) NOT AT [facility]    EKG EKG Interpretation  Date/Time:  Sunday July 24 2019 18:47:39 EST Ventricular Rate:  119 PR Interval:    QRS Duration: 85 QT Interval:  315 QTC Calculation: 444 R Axis:   32 Text Interpretation: Sinus tachycardia Ventricular bigeminy Confirmed by 08-[birth year]-1989 [phone]) on 07/24/2019 8:35:09 PM   Radiology Dg Chest [facility]  Result Date: 07/24/2019 CLINICAL DATA:  Palpitation EXAM: PORTABLE CHEST 1 VIEW COMPARISON:  10/24/2016 FINDINGS: The heart size and mediastinal contours are within normal limits. Both lungs are clear. The visualized skeletal structures are unremarkable. IMPRESSION: No acute abnormality of the lungs. Electronically Signed   By: Eddie Candle M.D.   On: 07/24/2019 19:36    Procedures Procedures (including critical care time)  Medications Ordered in ED Medications  sodium chloride 0.9 % bolus 1,000 mL (has no administration in time range)  LORazepam (ATIVAN) injection 0.5 mg (has no administration in time range)     Initial Impression / Assessment and Plan / ED Course  I have reviewed the triage vital signs and the nursing notes.  Pertinent labs & imaging results that were available during my care of the patient were reviewed by me and considered in my medical decision making (see chart for details).   Patient presents to the ED w/ complaints of palpitations & anxiety that began shortly PTA this evening, hx of similar sxs over the past couple of months since the death of her father. Nontoxic appearing, resting comfortably, vitals notable for tachycardia & mildly elevated BP on arrival- doubt HTN emergency. Other than her tachycardia & anxious appearance, benign physical exam. Plan for EKG, labs, CXR. Will give fluids & small dose of ativan.  CBC: Mild leukocytosis felt to be nonspecific. No anemia.  Platelets mildly elevated.  CMP: WNL. No electrolyte derangement. Normal renal function.  UDS: Negative UA: No UTI Preg test: Negative Wet prep: No BV, trich, or yeast.  GC/chlamydia/RPR/HIV: Pending- states she is monogamous, exam not consistent w/ PID TSH: WNL D-dimer: WNL- low risk wells- doubt PE.  EKG: No STEMI CXR: No acute abnormality of the lungs  20:35: RE-EVAL: Patient feeling much better symptomatically, tachycardia normalized.  Overall reassuring work-up. No significant abnormalities with blood work-no anemia, electrolyte derangement, or thyroid dysfunction. CXR normal. EKG sinus tachycardia. Unclear definitive etiology, possibly anxiety given improvement s/p ativan. Patient appears appropriate for discharge. Will provide prescription for PRN atarax and provide outpatient resources. I discussed results, treatment plan, need for follow-up, and return precautions with the patient. Provided opportunity for questions, patient confirmed understanding and is in agreement with plan.   Blood pressure 118/77, pulse 99, temperature 98.4 F (36.9 C), temperature source Oral, resp. rate 20, height 5\' 2"  (1.575 m), weight 108.9 kg, SpO2 98 %.  Final Clinical Impressions(s) / ED Diagnoses   Final diagnoses:  Palpitations    ED Discharge Orders         Ordered    hydrOXYzine (ATARAX/VISTARIL) 25 MG tablet  Every 6 hours PRN     07/24/19 2051           Amaryllis Dyke, PA-C 07/24/19 2129    Milton Ferguson, MD 07/25/19 1514

## 2019-07-24 NOTE — ED Triage Notes (Addendum)
Pt with palpitations.  Pt states her father passed away recently.

## 2019-07-26 LAB — GC/CHLAMYDIA PROBE AMP (~~LOC~~) NOT AT ARMC
Chlamydia: NEGATIVE
Neisseria Gonorrhea: NEGATIVE

## 2019-07-26 LAB — HIV ANTIBODY (ROUTINE TESTING W REFLEX): HIV Screen 4th Generation wRfx: NONREACTIVE — AB

## 2019-07-26 LAB — RPR: RPR Ser Ql: NONREACTIVE

## 2021-04-13 ENCOUNTER — Emergency Department (HOSPITAL_COMMUNITY)
Admission: EM | Admit: 2021-04-13 | Discharge: 2021-04-13 | Disposition: A | Discharge status: Home / Self Care | Payer: Medicaid Other | Attending: Emergency Medicine | Admitting: Emergency Medicine

## 2021-04-13 ENCOUNTER — Encounter (HOSPITAL_COMMUNITY): Payer: Self-pay | Admitting: Emergency Medicine

## 2021-04-13 ENCOUNTER — Other Ambulatory Visit: Payer: Self-pay

## 2021-04-13 DIAGNOSIS — J4530 Mild persistent asthma, uncomplicated: Secondary | ICD-10-CM | POA: Insufficient documentation

## 2021-04-13 DIAGNOSIS — K0889 Other specified disorders of teeth and supporting structures: Secondary | ICD-10-CM

## 2021-04-13 DIAGNOSIS — F17210 Nicotine dependence, cigarettes, uncomplicated: Secondary | ICD-10-CM | POA: Insufficient documentation

## 2021-04-13 LAB — POC URINE PREG, ED: Preg Test, Ur: NEGATIVE

## 2021-04-13 MED ORDER — HYDROCODONE-ACETAMINOPHEN 5-325 MG PO TABS
1.0000 | ORAL_TABLET | Freq: Once | ORAL | Status: AC
  Administered 2021-04-13: 1 via ORAL
  Filled 2021-04-13: qty 1

## 2021-04-13 MED ORDER — IBUPROFEN 800 MG PO TABS: 800.0000 mg | ORAL_TABLET | Freq: Three times a day (TID) | ORAL | 0 refills | Status: DC

## 2021-04-13 MED ORDER — PENICILLIN V POTASSIUM 500 MG PO TABS: 500.0000 mg | ORAL_TABLET | Freq: Three times a day (TID) | ORAL | 0 refills | Status: DC

## 2021-04-13 MED ORDER — HYDROCODONE-ACETAMINOPHEN 5-325 MG PO TABS: ORAL_TABLET | ORAL | 0 refills | Status: DC

## 2021-04-13 NOTE — ED Provider Notes (Signed)
[facility] EMERGENCY DEPARTMENT Provider Note   CSN: [id number] Arrival date & time: 04/13/21  1721     History Chief Complaint  Patient presents with   Dental Pain    Zoe Miller is a 25 y.o. female.   Dental Pain Associated symptoms: no facial swelling, no fever, no headaches and no neck pain        Zoe Miller is a 25 y.o. female who presents to the Emergency Department complaining of right upper dental pain since 2:00 today.  She states that she believes a filling has fell out of her right upper tooth.  She describes a sharp constant pain to her tooth that radiates to her face.  Pain is worse with sensation of hot or cold food or liquids.  She has tried over-the-counter pain relievers without improvement.  She denies any facial swelling, sore throat, difficulty swallowing, and neck pain.  She currently does not have a dentist and is unable to afford dental care.    Past Medical History:  Diagnosis Date   Anxiety    Asthma     Patient Active Problem List   Diagnosis Date Noted   Depression 11/05/2016   Anxiety 11/05/2016   Elevated blood pressure reading 06/19/2016   Birth control counseling 06/19/2016   Anxiety and depression 05/21/2016   Lymphadenopathy 08/17/2015   BMI 40.0-44.9, adult (HCC) 08/17/2015   Asthma, mild persistent 08/17/2015   Rhinitis, allergic 08/17/2015   Hirsutism 08/17/2015   PCOS (polycystic ovarian syndrome) 08/17/2015    Past Surgical History:  Procedure Laterality Date   APPENDECTOMY     TONSILLECTOMY       OB History   No obstetric history on file.     Family History  Problem Relation Age of Onset   Diabetes Mother     Social History   Tobacco Use   Smoking status: Every Day    Packs/day: 1.00    Types: Cigarettes   Smokeless tobacco: Never  Vaping Use   Vaping Use: Never used  Substance Use Topics   Alcohol use: No   Drug use: No    Home Medications Prior to Admission medications   Medication Sig Start  Date End Date Taking? Authorizing Provider  acetaminophen (TYLENOL) 500 MG tablet Take 500-1,000 mg by mouth every 6 (six) hours as needed for mild pain or moderate pain.    [provider]  hydrOXYzine (ATARAX/VISTARIL) 25 MG tablet Take 0.5-1 tablets (12.5-25 mg total) by mouth every 6 (six) hours as needed for anxiety. 07/24/19   Petrucelli, Samantha R, PA-C    Allergies    Lactose intolerance (gi) and Chocolate  Review of Systems   Review of Systems  Constitutional:  Negative for chills, fatigue and fever.  HENT:  Positive for dental problem. Negative for facial swelling, sore throat and trouble swallowing.   Eyes:  Negative for visual disturbance.  Respiratory:  Negative for cough.   Gastrointestinal:  Negative for nausea and vomiting.  Musculoskeletal:  Negative for myalgias, neck pain and neck stiffness.  Skin:  Negative for rash.  Neurological:  Negative for dizziness, weakness, numbness and headaches.  Hematological:  Does not bruise/bleed easily.   Physical Exam Updated Vital Signs BP (!) 132/91 (BP Location: Left Arm)   Pulse 82   Temp 97.9 F (36.6 C) (Oral)   Resp 17   Ht 5\' 2"  (1.575 m)   Wt 113.7 kg   LMP 02/11/2021   SpO2 99%   BMI 45.85 kg/m  Physical Exam Vitals and nursing note reviewed.  Constitutional:      Appearance: Normal appearance. She is not ill-appearing.  HENT:     Head: Normocephalic.     Mouth/Throat:     Mouth: Mucous membranes are moist. No oral lesions.     Dentition: No gingival swelling or dental abscesses.     Pharynx: Oropharynx is clear. Uvula midline. No pharyngeal swelling or uvula swelling.     Comments: Localized tenderness to palpation over the right upper second premolar.  Partially avulsed dental filling and dental caries.  No erythema or edema of the face or gingiva.  Uvula midline and nonedematous.  No sublingual abnormalities. Neck:     Thyroid: No thyromegaly.     Meningeal: Kernig's sign absent.   Cardiovascular:     Rate and Rhythm: Normal rate and regular rhythm.     Pulses: Normal pulses.  Pulmonary:     Effort: Pulmonary effort is normal.     Breath sounds: Normal breath sounds. No wheezing.  Abdominal:     Palpations: Abdomen is soft.     Tenderness: There is no abdominal tenderness. There is no guarding or rebound.  Musculoskeletal:        General: Normal range of motion.     Cervical back: Normal range of motion and neck supple.  Skin:    General: Skin is warm.     Capillary Refill: Capillary refill takes less than 2 seconds.     Findings: No rash.  Neurological:     General: No focal deficit present.     Mental Status: She is alert.    ED Results / Procedures / Treatments   Labs (all labs ordered are listed, but only abnormal results are displayed) Labs Reviewed - No data to display  EKG None  Radiology No results found.  Procedures Procedures   Medications Ordered in ED Medications - No data to display  ED Course  I have reviewed the triage vital signs and the nursing notes.  Pertinent labs & imaging results that were available during my care of the patient were reviewed by me and considered in my medical decision making (see chart for details).    MDM Rules/Calculators/A&P                           Patient here for right upper dental pain since 2 PM today.  No known injury.  Patient does admit to a portion of a dental filling falling out approximately 2 weeks ago. On exam, patient well-appearing nontoxic.  No concerning symptoms for Ludewig's angina.  No facial swelling or obvious dental abscess.  Patient appears appropriate for discharge home, will give referral information for local dentistry and short course of pain medication will be provided.   Final Clinical Impression(s) / ED Diagnoses Final diagnoses:  Pain, dental    Rx / DC Orders ED Discharge Orders     None        Pauline Aus, PA-C 04/13/21 1846    Bethann Berkshire,  MD 04/14/21 [phone]

## 2021-04-13 NOTE — Discharge Instructions (Addendum)
Take the antibiotic as directed until its finished.  Use Orajel as directed.  Call one of the dentist on the list provided on Monday to arrange a follow-up appointment.

## 2021-04-13 NOTE — ED Triage Notes (Signed)
Pt c/o upper right dental pain since 1400 today; reports a filling came out last week; pt reports lengthy dental problems but is unable to afford dental care; denies fever

## 2021-07-10 DIAGNOSIS — Z113 Encounter for screening for infections with a predominantly sexual mode of transmission: Secondary | ICD-10-CM | POA: Diagnosis not present

## 2021-07-10 DIAGNOSIS — Z0389 Encounter for observation for other suspected diseases and conditions ruled out: Secondary | ICD-10-CM | POA: Diagnosis not present

## 2021-07-10 DIAGNOSIS — Z3009 Encounter for other general counseling and advice on contraception: Secondary | ICD-10-CM | POA: Diagnosis not present

## 2021-07-10 DIAGNOSIS — Z1388 Encounter for screening for disorder due to exposure to contaminants: Secondary | ICD-10-CM | POA: Diagnosis not present

## 2021-07-10 DIAGNOSIS — N760 Acute vaginitis: Secondary | ICD-10-CM | POA: Diagnosis not present

## 2021-07-10 DIAGNOSIS — Z1331 Encounter for screening for depression: Secondary | ICD-10-CM | POA: Diagnosis not present

## 2021-07-10 DIAGNOSIS — Z114 Encounter for screening for human immunodeficiency virus [HIV]: Secondary | ICD-10-CM | POA: Diagnosis not present

## 2021-07-31 DIAGNOSIS — R300 Dysuria: Secondary | ICD-10-CM | POA: Diagnosis not present

## 2021-07-31 DIAGNOSIS — B3731 Acute candidiasis of vulva and vagina: Secondary | ICD-10-CM | POA: Diagnosis not present

## 2021-07-31 DIAGNOSIS — F419 Anxiety disorder, unspecified: Secondary | ICD-10-CM | POA: Diagnosis not present

## 2021-07-31 DIAGNOSIS — Z30011 Encounter for initial prescription of contraceptive pills: Secondary | ICD-10-CM | POA: Diagnosis not present

## 2021-12-22 ENCOUNTER — Emergency Department (HOSPITAL_COMMUNITY)
Admission: EM | Admit: 2021-12-22 | Discharge: 2021-12-22 | Discharge status: Against Medical Advice | Payer: Medicaid Other | Source: Home / Self Care

## 2022-03-06 ENCOUNTER — Other Ambulatory Visit: Payer: Self-pay

## 2022-03-06 ENCOUNTER — Encounter (HOSPITAL_COMMUNITY): Payer: Self-pay | Admitting: *Deleted

## 2022-03-06 ENCOUNTER — Emergency Department (HOSPITAL_COMMUNITY)
Admission: EM | Admit: 2022-03-06 | Discharge: 2022-03-07 | Disposition: A | Discharge status: Home / Self Care | Payer: Self-pay | Attending: Emergency Medicine | Admitting: Emergency Medicine

## 2022-03-06 DIAGNOSIS — K0889 Other specified disorders of teeth and supporting structures: Secondary | ICD-10-CM | POA: Insufficient documentation

## 2022-03-06 NOTE — ED Triage Notes (Addendum)
Pt with right upper dental pain after bending over per pt that started tonight.  Pt states she does not have a dentist. Pt states she has not taken anything for the pain.

## 2022-03-07 MED ORDER — HYDROCODONE-ACETAMINOPHEN 5-325 MG PO TABS: 1.0000 | ORAL_TABLET | Freq: Four times a day (QID) | ORAL | 0 refills | Status: DC | PRN

## 2022-03-07 MED ORDER — OXYCODONE-ACETAMINOPHEN 5-325 MG PO TABS
2.0000 | ORAL_TABLET | Freq: Once | ORAL | Status: AC
  Administered 2022-03-07: 2 via ORAL
  Filled 2022-03-07: qty 2

## 2022-03-07 MED ORDER — CLINDAMYCIN HCL 150 MG PO CAPS
300.0000 mg | ORAL_CAPSULE | Freq: Once | ORAL | Status: AC
  Administered 2022-03-07: 300 mg via ORAL
  Filled 2022-03-07: qty 2

## 2022-03-07 MED ORDER — CLINDAMYCIN HCL 300 MG PO CAPS: 300.0000 mg | ORAL_CAPSULE | Freq: Four times a day (QID) | ORAL | 0 refills | Status: DC

## 2022-03-07 NOTE — ED Provider Notes (Signed)
[facility] EMERGENCY DEPARTMENT Provider Note   CSN: [id number] Arrival date & time: 03/06/22  2145     History  Chief Complaint  Patient presents with   Dental Pain    Zoe Miller is a 26 y.o. female.  Patient is a 26 year old female with history of polycystic ovaries, anxiety.  Patient presenting today with complaints of dental pain.  She reports a 88-month history of pain to her right upper molars, but became much worse over the past 2 days.  She describes the pain as "agonizing".  She describes a constant throbbing that is worse when she eats or drinks.  She denies any alleviating factors.  The history is provided by the patient.       Home Medications Prior to Admission medications   Medication Sig Start Date End Date Taking? Authorizing Provider  HYDROcodone-acetaminophen (NORCO/VICODIN) 5-325 MG tablet Take one tab po q 4 hrs prn pain 04/13/21   Triplett, Tammy, PA-C  hydrOXYzine (ATARAX/VISTARIL) 25 MG tablet Take 0.5-1 tablets (12.5-25 mg total) by mouth every 6 (six) hours as needed for anxiety. 07/24/19   Petrucelli, Samantha R, PA-C  ibuprofen (ADVIL) 800 MG tablet Take 1 tablet (800 mg total) by mouth 3 (three) times daily. Take with food 04/13/21   Triplett, Tammy, PA-C  penicillin v potassium (VEETID) 500 MG tablet Take 1 tablet (500 mg total) by mouth 3 (three) times daily. 04/13/21   Triplett, Tammy, PA-C      Allergies    Lactose intolerance (gi) and Chocolate    Review of Systems   Review of Systems  All other systems reviewed and are negative.   Physical Exam Updated Vital Signs BP (!) 134/92 (BP Location: Right Arm)   Pulse 95   Temp 98.2 F (36.8 C) (Oral)   Resp 19   Ht 5\' 2"  (1.575 m)   Wt 108.9 kg   LMP 02/20/2022   SpO2 100%   BMI 43.90 kg/m  Physical Exam Vitals and nursing note reviewed.  Constitutional:      General: She is not in acute distress.    Appearance: Normal appearance. She is not ill-appearing.  HENT:     Head:  Normocephalic and atraumatic.     Mouth/Throat:     Mouth: Mucous membranes are moist.     Comments: The rear right second and third molars are with decay and surrounding gingival inflammation.  There is no frank abscess noted.  There is no swelling of the submandibular soft tissues. Pulmonary:     Effort: Pulmonary effort is normal.  Skin:    General: Skin is warm and dry.  Neurological:     Mental Status: She is alert.     ED Results / Procedures / Treatments   Labs (all labs ordered are listed, but only abnormal results are displayed) Labs Reviewed - No data to display  EKG None  Radiology No results found.  Procedures Procedures    Medications Ordered in ED Medications  oxyCODONE-acetaminophen (PERCOCET/ROXICET) 5-325 MG per tablet 2 tablet (has no administration in time range)  clindamycin (CLEOCIN) capsule 300 mg (has no administration in time range)    ED Course/ Medical Decision Making/ A&P  Patient presenting with complaints of dental pain.  She has decayed dentition and inflamed gingiva to the right upper rear molars.  Patient will be treated with clindamycin and pain medicine.  She is to follow-up with dentistry.  Final Clinical Impression(s) / ED Diagnoses Final diagnoses:  None    Rx /  DC Orders ED Discharge Orders     None         Geoffery Lyons, MD 03/07/22 [phone]

## 2022-03-07 NOTE — Discharge Instructions (Signed)
Begin taking clindamycin as prescribed.  Begin taking hydrocodone as prescribed as needed for pain.  Follow-up with dentistry in the next few days. 

## 2022-06-16 DIAGNOSIS — Z1331 Encounter for screening for depression: Secondary | ICD-10-CM | POA: Diagnosis not present

## 2022-06-16 DIAGNOSIS — N644 Mastodynia: Secondary | ICD-10-CM | POA: Diagnosis not present

## 2022-06-16 DIAGNOSIS — N6452 Nipple discharge: Secondary | ICD-10-CM | POA: Diagnosis not present

## 2022-06-16 DIAGNOSIS — Z3041 Encounter for surveillance of contraceptive pills: Secondary | ICD-10-CM | POA: Diagnosis not present

## 2022-06-16 DIAGNOSIS — Z3009 Encounter for other general counseling and advice on contraception: Secondary | ICD-10-CM | POA: Diagnosis not present

## 2022-08-08 DIAGNOSIS — Z419 Encounter for procedure for purposes other than remedying health state, unspecified: Secondary | ICD-10-CM | POA: Diagnosis not present

## 2022-09-08 DIAGNOSIS — Z419 Encounter for procedure for purposes other than remedying health state, unspecified: Secondary | ICD-10-CM | POA: Diagnosis not present

## 2022-11-05 ENCOUNTER — Telehealth: Payer: Self-pay

## 2022-11-05 NOTE — Telephone Encounter (Signed)
Mychart msg sent

## 2022-12-29 DIAGNOSIS — Z5321 Procedure and treatment not carried out due to patient leaving prior to being seen by health care provider: Secondary | ICD-10-CM | POA: Insufficient documentation

## 2022-12-29 DIAGNOSIS — K0889 Other specified disorders of teeth and supporting structures: Secondary | ICD-10-CM | POA: Insufficient documentation

## 2022-12-30 ENCOUNTER — Other Ambulatory Visit: Payer: Self-pay

## 2022-12-30 ENCOUNTER — Emergency Department (HOSPITAL_COMMUNITY)
Admission: EM | Admit: 2022-12-30 | Discharge: 2022-12-30 | Discharge status: Against Medical Advice | Payer: Medicaid Other | Attending: Emergency Medicine | Admitting: Emergency Medicine

## 2022-12-30 NOTE — ED Triage Notes (Signed)
Pt c/o right side dental pain that started a few days ago,

## 2022-12-31 ENCOUNTER — Emergency Department (HOSPITAL_COMMUNITY)
Admission: EM | Admit: 2022-12-31 | Discharge: 2023-01-01 | Disposition: A | Discharge status: Home / Self Care | Payer: Medicaid Other | Attending: Emergency Medicine | Admitting: Emergency Medicine

## 2022-12-31 ENCOUNTER — Encounter (HOSPITAL_COMMUNITY): Payer: Self-pay

## 2022-12-31 ENCOUNTER — Other Ambulatory Visit: Payer: Self-pay

## 2022-12-31 DIAGNOSIS — K0889 Other specified disorders of teeth and supporting structures: Secondary | ICD-10-CM | POA: Diagnosis not present

## 2022-12-31 DIAGNOSIS — K029 Dental caries, unspecified: Secondary | ICD-10-CM | POA: Diagnosis not present

## 2022-12-31 MED ORDER — IBUPROFEN 800 MG PO TABS
800.0000 mg | ORAL_TABLET | Freq: Once | ORAL | Status: AC
  Administered 2022-12-31: 800 mg via ORAL
  Filled 2022-12-31: qty 1

## 2022-12-31 MED ORDER — IBUPROFEN 800 MG PO TABS: 800.0000 mg | ORAL_TABLET | Freq: Four times a day (QID) | ORAL | 0 refills | Status: DC | PRN

## 2022-12-31 MED ORDER — AMOXICILLIN 500 MG PO CAPS: 500.0000 mg | ORAL_CAPSULE | Freq: Three times a day (TID) | ORAL | 0 refills | Status: DC

## 2022-12-31 MED ORDER — AMOXICILLIN 250 MG PO CAPS
1000.0000 mg | ORAL_CAPSULE | Freq: Once | ORAL | Status: AC
  Administered 2022-12-31: 1000 mg via ORAL
  Filled 2022-12-31: qty 4

## 2022-12-31 NOTE — ED Provider Notes (Signed)
[place] EMERGENCY DEPARTMENT AT [facility] Provider Note   CSN: [id number] Arrival date & time: 12/31/22  2242     History  Chief Complaint  Patient presents with   Dental Pain    Zoe Miller is a 27 y.o. female.  Presents to the emergency department for evaluation of dental pain.  Patient reports that the pain has been on and off for several weeks.  She has been taking Tylenol at home but it has not helped.  No fever or facial swelling.       Home Medications Prior to Admission medications   Medication Sig Start Date End Date Taking? Authorizing Provider  amoxicillin (AMOXIL) 500 MG capsule Take 1 capsule (500 mg total) by mouth 3 (three) times daily. 12/31/22  Yes Zoya Sprecher, Canary Brim, MD  ibuprofen (ADVIL) 800 MG tablet Take 1 tablet (800 mg total) by mouth every 6 (six) hours as needed for moderate pain. 12/31/22  Yes Braeden Kennan, Canary Brim, MD      Allergies    Lactose intolerance (gi) and Chocolate    Review of Systems   Review of Systems  Physical Exam Updated Vital Signs BP (!) 135/98 (BP Location: Right Arm)   Pulse 95   Temp 98.8 F (37.1 C) (Oral)   Resp 15   Ht  (1.575 m)   Wt 108.9 kg   LMP 11/29/2022 (Exact Date)   SpO2 99%   BMI 43.90 kg/m  Physical Exam Vitals and nursing note reviewed.  Constitutional:      General: She is not in acute distress.    Appearance: She is well-developed.  HENT:     Head: Normocephalic and atraumatic.     Mouth/Throat:     Mouth: Mucous membranes are moist.     Dentition: Abnormal dentition. Dental tenderness and dental caries present.   Eyes:     General: Vision grossly intact. Gaze aligned appropriately.     Extraocular Movements: Extraocular movements intact.     Conjunctiva/sclera: Conjunctivae normal.  Cardiovascular:     Rate and Rhythm: Normal rate and regular rhythm.     Pulses: Normal pulses.     Heart sounds: Normal heart sounds, S1 normal and S2 normal. No murmur heard.     No friction rub. No gallop.  Pulmonary:     Effort: Pulmonary effort is normal. No respiratory distress.     Breath sounds: Normal breath sounds.  Abdominal:     General: Bowel sounds are normal.     Palpations: Abdomen is soft.     Tenderness: There is no abdominal tenderness. There is no guarding or rebound.     Hernia: No hernia is present.  Musculoskeletal:        General: No swelling.     Cervical back: Full passive range of motion without pain, normal range of motion and neck supple. No spinous process tenderness or muscular tenderness. Normal range of motion.     Right lower leg: No edema.     Left lower leg: No edema.  Skin:    General: Skin is warm and dry.     Capillary Refill: Capillary refill takes less than 2 seconds.     Findings: No ecchymosis, erythema, rash or wound.  Neurological:     General: No focal deficit present.     Mental Status: She is alert and oriented to person, place, and time.     GCS: GCS eye subscore is 4. GCS verbal subscore is 5. GCS motor  subscore is 6.     Cranial Nerves: Cranial nerves 2-12 are intact.     Sensory: Sensation is intact.     Motor: Motor function is intact.     Coordination: Coordination is intact.  Psychiatric:        Attention and Perception: Attention normal.        Mood and Affect: Mood normal.        Speech: Speech normal.        Behavior: Behavior normal.     ED Results / Procedures / Treatments   Labs (all labs ordered are listed, but only abnormal results are displayed) Labs Reviewed - No data to display  EKG None  Radiology No results found.  Procedures Procedures    Medications Ordered in ED Medications  amoxicillin (AMOXIL) capsule 1,000 mg (has no administration in time range)  ibuprofen (ADVIL) tablet 800 mg (has no administration in time range)    ED Course/ Medical Decision Making/ A&P                             Medical Decision Making Risk Prescription drug management.   Signs with  dental pain.  Differential diagnosis considered includes, but not limited to: Dental caries, dental abscess  No facial swelling noted on exam.  No fluctuance internally.  Patient with multiple caries.        Final Clinical Impression(s) / ED Diagnoses Final diagnoses:  Pain, dental    Rx / DC Orders ED Discharge Orders          Ordered    amoxicillin (AMOXIL) 500 MG capsule  3 times daily        12/31/22 2348    ibuprofen (ADVIL) 800 MG tablet  Every 6 hours PRN        12/31/22 2348              Gilda Crease, MD 12/31/22 2350

## 2022-12-31 NOTE — ED Triage Notes (Signed)
Pt arrived from home via POV c/o dental pain x 3 plus weeks off and on. Upper left. Pt states that she has been taking ACTM for pain with no help. 10/10 on pain scale

## 2023-01-01 ENCOUNTER — Telehealth: Payer: Self-pay

## 2023-01-01 NOTE — Telephone Encounter (Signed)
Attempted to call as Ms Zoe Miller had an appointment this morning to enroll in [employer] at 9am. Noted she had an ER visit 12/31/22 and discharged after midnight for dental pain.  Unsure if she would be at appointment this morning. Called to leave a message to determine if she needed to reschedule requesting return call.   Francee Nodal RN [employer]

## 2023-01-05 ENCOUNTER — Telehealth: Payer: Self-pay

## 2023-01-05 NOTE — Telephone Encounter (Signed)
sent appointment reminder regarding upcoming PCP appt at [facility] on 5.1.24 @9 :30am

## 2023-01-05 NOTE — Telephone Encounter (Signed)
followed up with the patient by phone a second time on today regarding her medications and if she had filled those  prescriptions prescribed by [facility] ER providers.  Per review of her chart it was discovered pt was prescribed 2 meds to assist with her dental pain and I wanted to ensure that she was following the instructions per discharge regarding taken those meds.   Therefore additional steps were taken to contact the patient immediately  Pt states she never picked up her (antibiotic-amoxil nor the ibuprofen)  She states she does not and did not have the funds to pick up the meds from [facility] as she thought.  PLAN -Pt was advised we could provide medication assistance by way of providing her with 1 [facility] card of 10.00, with the promise of her paying the remaining cost of the 3.00 towards the remaining cost prescription of  $12.97  -Pt was advised in order for her pain relief to subside and the dental treatment to work, that medications prescribed by the ER providers must be taken in order to assist with her care  Pt states she understood and would pick up the medication fund assistance on Tues 4.30.24 to take to [facility] to get the medicines filled.    Call then ended

## 2023-01-05 NOTE — Congregational Nurse Program (Signed)
Pt was followed up by phone on today to complete heath interview/assessment  Pt has completed enrollment into the [employer] Connect Uninsured Program on today 4.29.24 and has requested to establish care with a new primary care provider due to not seeing PCP since 6-7 years ago, but does admit she utilized the ER quite a lot.  Chief Reasons Needed for  PCP -Dental Pain  - Management  of Polycystic Ovarian Syndrome (POS) -Anxiety and GERD -Medication refills needed (Anxiety, Asthma, POS) -Pain management of hand/wrist pain as pt states as "arthritis"  Past /Current Medical History (stated by patient) Hx  of asthma (out of own meds, but utilizes sibling unused inhaler) Hx of Anxiety/Depression with hx of meds (but currently have none  available Hx of GERD- otc meds when needed Hx of Arthritis - (no meds  taken or ever diagnosed)  Socio-determinants health needs identified: -On-going Medication Assistance resource  -Food insecurity      Plan - Librarian, academic Initiated and emailed for eligibility review and pending approval Radio broadcast assistant completed but awaiting Medicaid decision letter approval prior to forwarding to [facility] -Pt will receive nurse case management upon completion of first medical appointment  _Referred to [employer] on site therapy at [facility] [place] [facility] due to [id number] and requesting services -Educated pt on utilization of PCP and Urgent Care versus how to reduce utilization of the ER Dept if it does not consider to be an emergency  -Scheduled Appointment for first medical visit scheduled for [facility] [place] on Wednesday, May 1@ 9:30 am at the [facility] [place].

## 2023-01-07 ENCOUNTER — Other Ambulatory Visit (HOSPITAL_COMMUNITY)
Admission: RE | Admit: 2023-01-07 | Discharge: 2023-01-07 | Disposition: A | Discharge status: Home / Self Care | Payer: Medicaid Other | Source: Ambulatory Visit | Attending: Physician Assistant | Admitting: Physician Assistant

## 2023-01-07 ENCOUNTER — Ambulatory Visit: Payer: Self-pay | Admitting: Physician Assistant

## 2023-01-07 ENCOUNTER — Encounter: Payer: Self-pay | Admitting: Physician Assistant

## 2023-01-07 VITALS — BP 110/84 | Temp 97.4°F | Ht 62.5 in | Wt 260.0 lb

## 2023-01-07 DIAGNOSIS — E781 Pure hyperglyceridemia: Secondary | ICD-10-CM

## 2023-01-07 DIAGNOSIS — F32A Depression, unspecified: Secondary | ICD-10-CM

## 2023-01-07 DIAGNOSIS — F17200 Nicotine dependence, unspecified, uncomplicated: Secondary | ICD-10-CM

## 2023-01-07 DIAGNOSIS — Z131 Encounter for screening for diabetes mellitus: Secondary | ICD-10-CM

## 2023-01-07 DIAGNOSIS — F419 Anxiety disorder, unspecified: Secondary | ICD-10-CM | POA: Diagnosis present

## 2023-01-07 DIAGNOSIS — Z130 Encounter for screening for diseases of the blood and blood-forming organs and certain disorders involving the immune mechanism: Secondary | ICD-10-CM

## 2023-01-07 DIAGNOSIS — Z833 Family history of diabetes mellitus: Secondary | ICD-10-CM | POA: Insufficient documentation

## 2023-01-07 DIAGNOSIS — K0889 Other specified disorders of teeth and supporting structures: Secondary | ICD-10-CM

## 2023-01-07 DIAGNOSIS — N926 Irregular menstruation, unspecified: Secondary | ICD-10-CM

## 2023-01-07 DIAGNOSIS — Z7689 Persons encountering health services in other specified circumstances: Secondary | ICD-10-CM

## 2023-01-07 DIAGNOSIS — Z419 Encounter for procedure for purposes other than remedying health state, unspecified: Secondary | ICD-10-CM | POA: Diagnosis not present

## 2023-01-07 DIAGNOSIS — E282 Polycystic ovarian syndrome: Secondary | ICD-10-CM

## 2023-01-07 LAB — TSH: TSH: 1.789 u[IU]/mL (ref 0.350–4.500)

## 2023-01-07 LAB — CBC
HCT: 41.3 % (ref 36.0–46.0)
Hemoglobin: 13.9 g/dL (ref 12.0–15.0)
MCH: 29.3 pg (ref 26.0–34.0)
MCHC: 33.7 g/dL (ref 30.0–36.0)
MCV: 87.1 fL (ref 80.0–100.0)
Platelets: 412 10*3/uL — ABNORMAL HIGH (ref 150–400)
RBC: 4.74 MIL/uL (ref 3.87–5.11)
RDW: 13.1 % (ref 11.5–15.5)
WBC: 9.3 10*3/uL (ref 4.0–10.5)
nRBC: 0 % (ref 0.0–0.2)

## 2023-01-07 LAB — LIPID PANEL
Cholesterol: 148 mg/dL (ref 0–200)
HDL: 42 mg/dL (ref >40)
LDL Cholesterol: 88 mg/dL (ref 0–99)
Total CHOL/HDL Ratio: 3.5 RATIO
Triglycerides: 88 mg/dL (ref <150)
VLDL: 18 mg/dL (ref 0–40)

## 2023-01-07 LAB — COMPREHENSIVE METABOLIC PANEL
ALT: 30 U/L (ref 0–44)
AST: 18 U/L (ref 15–41)
Albumin: 3.8 g/dL (ref 3.5–5.0)
Alkaline Phosphatase: 78 U/L (ref 38–126)
Anion gap: 7 (ref 5–15)
BUN: 12 mg/dL (ref 6–20)
CO2: 26 mmol/L (ref 22–32)
Calcium: 8.6 mg/dL — ABNORMAL LOW (ref 8.9–10.3)
Chloride: 103 mmol/L (ref 98–111)
Creatinine, Ser: 0.84 mg/dL (ref 0.44–1.00)
GFR, Estimated: >60 mL/min (ref >60)
Glucose, Bld: 94 mg/dL (ref 70–99)
Potassium: 3.7 mmol/L (ref 3.5–5.1)
Sodium: 136 mmol/L (ref 135–145)
Total Bilirubin: 0.6 mg/dL (ref 0.3–1.2)
Total Protein: 7 g/dL (ref 6.5–8.1)

## 2023-01-07 LAB — HEMOGLOBIN A1C
Hgb A1c MFr Bld: 5.5 % (ref 4.8–5.6)
Mean Plasma Glucose: 111.15 mg/dL

## 2023-01-07 MED ORDER — ESCITALOPRAM OXALATE 10 MG PO TABS: 10.0000 mg | ORAL_TABLET | Freq: Every day | ORAL | 0 refills | Status: DC
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
## 2023-01-13 ENCOUNTER — Ambulatory Visit: Payer: Self-pay

## 2023-01-21 ENCOUNTER — Ambulatory Visit: Payer: Self-pay | Admitting: Physician Assistant

## 2023-02-07 DIAGNOSIS — Z419 Encounter for procedure for purposes other than remedying health state, unspecified: Secondary | ICD-10-CM | POA: Diagnosis not present

## 2023-03-09 DIAGNOSIS — Z419 Encounter for procedure for purposes other than remedying health state, unspecified: Secondary | ICD-10-CM | POA: Diagnosis not present

## 2023-03-30 ENCOUNTER — Encounter (HOSPITAL_COMMUNITY): Payer: Self-pay | Admitting: Emergency Medicine

## 2023-03-30 ENCOUNTER — Emergency Department (HOSPITAL_COMMUNITY): Payer: Medicaid Other

## 2023-03-30 ENCOUNTER — Other Ambulatory Visit: Payer: Self-pay

## 2023-03-30 ENCOUNTER — Emergency Department (HOSPITAL_COMMUNITY)
Admission: EM | Admit: 2023-03-30 | Discharge: 2023-03-30 | Disposition: A | Discharge status: Home / Self Care | Payer: Medicaid Other | Attending: Emergency Medicine | Admitting: Emergency Medicine

## 2023-03-30 DIAGNOSIS — S161XXA Strain of muscle, fascia and tendon at neck level, initial encounter: Secondary | ICD-10-CM | POA: Insufficient documentation

## 2023-03-30 DIAGNOSIS — X500XXA Overexertion from strenuous movement or load, initial encounter: Secondary | ICD-10-CM | POA: Diagnosis not present

## 2023-03-30 DIAGNOSIS — S29012A Strain of muscle and tendon of back wall of thorax, initial encounter: Secondary | ICD-10-CM | POA: Diagnosis not present

## 2023-03-30 DIAGNOSIS — M542 Cervicalgia: Secondary | ICD-10-CM | POA: Diagnosis not present

## 2023-03-30 DIAGNOSIS — S29019A Strain of muscle and tendon of unspecified wall of thorax, initial encounter: Secondary | ICD-10-CM

## 2023-03-30 LAB — POC URINE PREG, ED: Preg Test, Ur: NEGATIVE

## 2023-03-30 MED ORDER — CYCLOBENZAPRINE HCL 10 MG PO TABS: 10.0000 mg | ORAL_TABLET | Freq: Three times a day (TID) | ORAL | 0 refills | Status: DC | PRN

## 2023-03-30 NOTE — ED Provider Notes (Signed)
[place] EMERGENCY DEPARTMENT AT [facility] Provider Note   CSN: [id number] Arrival date & time: 03/30/23  0235     History  Chief Complaint  Patient presents with   Neck Pain    Zoe Miller is a 27 y.o. female.  The history is provided by the patient.  Neck Pain She has been having pain in her left lower neck and upper thoracic spine for about the last week.  Pain sometimes radiates around to her upper chest and into her upper arms.  She denies weakness, numbness, tingling.  Her job does involve a lot of heavy lifting.  She also states that she had a fall about 1-2 months ago.  She has been taking acetaminophen for pain with no relief.   Home Medications Prior to Admission medications   Medication Sig Start Date End Date Taking? Authorizing Provider  amoxicillin (AMOXIL) 500 MG capsule Take 1 capsule (500 mg total) by mouth 3 (three) times daily. 12/31/22   Gilda Crease, MD  escitalopram (LEXAPRO) 10 MG tablet Take 1 tablet (10 mg total) by mouth daily. 01/07/23   Jacquelin [facility], PA-C  ibuprofen (ADVIL) 800 MG tablet Take 1 tablet (800 mg total) by mouth every 6 (six) hours as needed for moderate pain. 12/31/22   Gilda Crease, MD      Allergies    Garlic, Lactose intolerance (gi), and Chocolate    Review of Systems   Review of Systems  Musculoskeletal:  Positive for neck pain.  All other systems reviewed and are negative.   Physical Exam Updated Vital Signs BP 120/74 (BP Location: Left Arm)   Pulse 71   Temp 97.9 F (36.6 C) (Oral)   Resp 16   Ht 5' 2.5" (1.588 m)   Wt 110.2 kg   LMP 02/27/2023 (Exact Date)   SpO2 97%   BMI 43.74 kg/m  Physical Exam Vitals and nursing note reviewed.   27 year old female, resting comfortably and in no acute distress. Vital signs are normal. Oxygen saturation is 97%, which is normal. Head is normocephalic and atraumatic. PERRLA, EOMI. Oropharynx is clear. Neck is tender in the lower cervical  region without point tenderness. Back is mildly tender in the upper thoracic spine without point tenderness.  There is moderate paraspinal muscle spasm which is worse on the right. Lungs are clear without rales, wheezes, or rhonchi. Chest is nontender. Heart has regular rate and rhythm without murmur. Abdomen is soft, flat, nontender. Extremities have no cyanosis or edema, full range of motion is present. Skin is warm and dry without rash. Neurologic: Mental status is normal, cranial nerves are intact, strength is 5/5 in all 4 extremities - upper extremity muscles tested included shoulder abduction, shoulder abduction, elbow extension, elbow flexion, grip strength.  Pinprick sensation is intact.  ED Results / Procedures / Treatments    Radiology DG Thoracic Spine 4V  Result Date: 03/30/2023 CLINICAL DATA:  Neck pain and discomfort. EXAM: THORACIC SPINE - 4 VIEW COMPARISON:  None Available. FINDINGS: There is no evidence of thoracic spine fracture. Alignment is normal with no worrisome change during flexion and extension. No other significant bone abnormalities are identified. IMPRESSION: Negative. Electronically Signed   By: Tiburcio Pea M.D.   On: 03/30/2023 04:04   DG Cervical Spine Complete  Result Date: 03/30/2023 CLINICAL DATA:  Neck pain and discomfort EXAM: CERVICAL SPINE - COMPLETE 4 VIEW COMPARISON:  None Available. FINDINGS: There is no evidence of cervical spine fracture or prevertebral  soft tissue swelling. Alignment is normal. No other significant bone abnormalities are identified. IMPRESSION: Negative cervical spine radiographs. Electronically Signed   By: Tiburcio Pea M.D.   On: 03/30/2023 04:04    Procedures Procedures    Medications Ordered in ED Medications - No data to display  ED Course/ Medical Decision Making/ A&P                             Medical Decision Making Amount and/or Complexity of Data Reviewed Radiology: ordered.  Risk Prescription drug  management.   Upper thoracic and lower cervical pain likely related to muscular strain, possible cervical thoracic sprain.  No evidence of any neurologic injury.  I have ordered x-rays of the cervical and thoracic spine.  X-rays show no evidence of fracture or dislocation.  I have independently viewed the images, and agree with radiologist's interpretation.  I am discharging her with instructions use over-the-counter NSAIDs and acetaminophen, recommended ice application, and I have ordered a prescription for cyclobenzaprine to use as needed.  I have referred her to orthopedics for follow-up.  Final Clinical Impression(s) / ED Diagnoses Final diagnoses:  Acute strain of neck muscle, initial encounter  Thoracic myofascial strain, initial encounter    Rx / DC Orders ED Discharge Orders          Ordered    cyclobenzaprine (FLEXERIL) 10 MG tablet  3 times daily PRN        03/30/23 0417              Dione Booze, MD 03/30/23 [phone]

## 2023-03-30 NOTE — Discharge Instructions (Signed)
Apply ice for 30 minutes at a time, 4 times a day.  Take either ibuprofen or naproxen as needed for pain.  To get additional pain relief, add acetaminophen.

## 2023-03-30 NOTE — ED Triage Notes (Signed)
Pt with c/o neck pain x 1 week. States hurts to "lift her head" and that she is not able to sleep d/t the pain. Denies any known injury.

## 2023-03-30 NOTE — ED Notes (Signed)
Transported to CT 

## 2023-04-03 ENCOUNTER — Encounter: Payer: Self-pay | Admitting: Orthopedic Surgery

## 2023-04-03 ENCOUNTER — Ambulatory Visit: Payer: Medicaid Other | Admitting: Orthopedic Surgery

## 2023-04-03 VITALS — BP 139/86 | HR 87 | Ht 62.0 in | Wt 260.0 lb

## 2023-04-03 DIAGNOSIS — M25511 Pain in right shoulder: Secondary | ICD-10-CM

## 2023-04-03 DIAGNOSIS — M25512 Pain in left shoulder: Secondary | ICD-10-CM | POA: Diagnosis not present

## 2023-04-03 DIAGNOSIS — M542 Cervicalgia: Secondary | ICD-10-CM

## 2023-04-03 MED ORDER — TIZANIDINE HCL 4 MG PO TABS
4.0000 mg | ORAL_TABLET | Freq: Four times a day (QID) | ORAL | 0 refills | Status: AC | PRN
Start: 2023-04-03 — End: ?

## 2023-04-03 MED ORDER — GABAPENTIN 100 MG PO CAPS
100.0000 mg | ORAL_CAPSULE | Freq: Three times a day (TID) | ORAL | 2 refills | Status: AC
Start: 2023-04-03 — End: ?

## 2023-04-03 MED ORDER — PREDNISONE 10 MG PO TABS
10.0000 mg | ORAL_TABLET | Freq: Three times a day (TID) | ORAL | 1 refills | Status: AC
Start: 2023-04-03 — End: ?
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
## 2023-04-03 NOTE — Patient Instructions (Signed)
Physical therapy has been ordered for you at [facility]. They should call you to schedule, [phone] is the phone number to call, if you want to call to schedule.   

## 2023-04-09 DIAGNOSIS — Z419 Encounter for procedure for purposes other than remedying health state, unspecified: Secondary | ICD-10-CM | POA: Diagnosis not present

## 2023-04-11 ENCOUNTER — Emergency Department (HOSPITAL_COMMUNITY)
Admission: EM | Admit: 2023-04-11 | Discharge: 2023-04-11 | Disposition: A | Discharge status: Home / Self Care | Payer: Medicaid Other | Source: Home / Self Care | Attending: Emergency Medicine | Admitting: Emergency Medicine

## 2023-04-11 ENCOUNTER — Other Ambulatory Visit: Payer: Self-pay

## 2023-04-11 ENCOUNTER — Encounter (HOSPITAL_COMMUNITY): Payer: Self-pay | Admitting: Emergency Medicine

## 2023-04-11 ENCOUNTER — Emergency Department (HOSPITAL_COMMUNITY): Payer: Medicaid Other

## 2023-04-11 DIAGNOSIS — Z87891 Personal history of nicotine dependence: Secondary | ICD-10-CM | POA: Insufficient documentation

## 2023-04-11 DIAGNOSIS — N3000 Acute cystitis without hematuria: Secondary | ICD-10-CM | POA: Diagnosis not present

## 2023-04-11 DIAGNOSIS — Z7952 Long term (current) use of systemic steroids: Secondary | ICD-10-CM | POA: Diagnosis not present

## 2023-04-11 DIAGNOSIS — N83202 Unspecified ovarian cyst, left side: Secondary | ICD-10-CM | POA: Diagnosis not present

## 2023-04-11 DIAGNOSIS — R109 Unspecified abdominal pain: Secondary | ICD-10-CM | POA: Diagnosis not present

## 2023-04-11 DIAGNOSIS — J45909 Unspecified asthma, uncomplicated: Secondary | ICD-10-CM | POA: Diagnosis not present

## 2023-04-11 DIAGNOSIS — N83201 Unspecified ovarian cyst, right side: Secondary | ICD-10-CM | POA: Diagnosis not present

## 2023-04-11 LAB — URINALYSIS, ROUTINE W REFLEX MICROSCOPIC
Bilirubin Urine: NEGATIVE
Glucose, UA: NEGATIVE mg/dL
Hgb urine dipstick: NEGATIVE
Ketones, ur: NEGATIVE mg/dL
Nitrite: POSITIVE — AB
Protein, ur: NEGATIVE mg/dL
Specific Gravity, Urine: 1.017 (ref 1.005–1.030)
pH: 5 (ref 5.0–8.0)

## 2023-04-11 LAB — PREGNANCY, URINE: Preg Test, Ur: NEGATIVE

## 2023-04-11 MED ORDER — CIPROFLOXACIN HCL 500 MG PO TABS: 500.0000 mg | ORAL_TABLET | Freq: Two times a day (BID) | ORAL | 0 refills | Status: AC

## 2023-04-11 NOTE — ED Provider Notes (Addendum)
[place] EMERGENCY DEPARTMENT AT [facility] Provider Note   CSN: [id number] Arrival date & time: 04/11/23  7829     History  Chief Complaint  Patient presents with   Back Pain    Zoe Miller is a 27 y.o. female.  Patient with a complaint of right CVA pain right flank pain radiating around to the right lower quadrant part of the abdomen for the past 4 days.  Patient states her urine has been cloudy.  She does have a history of urinary tract infections.  Patient has been taking some amoxicillin she had leftover at home is still taking that no improvement in symptoms.  No nausea no vomiting no fevers.  Past medical history significant for asthma anxiety panic attacks gastroesophageal reflux disease polycystic ovarian syndrome.  Patient former smoker.       Home Medications Prior to Admission medications   Medication Sig Start Date End Date Taking? Authorizing Provider  gabapentin (NEURONTIN) 100 MG capsule Take 1 capsule (100 mg total) by mouth 3 (three) times daily. 04/03/23   Vickki Hearing, MD  predniSONE (DELTASONE) 10 MG tablet Take 1 tablet (10 mg total) by mouth 3 (three) times daily. 04/03/23   Vickki Hearing, MD  tiZANidine (ZANAFLEX) 4 MG tablet Take 1 tablet (4 mg total) by mouth every 6 (six) hours as needed for muscle spasms. 04/03/23   Vickki Hearing, MD      Allergies    Garlic, Lactose intolerance (gi), and Chocolate    Review of Systems   Review of Systems  Constitutional:  Negative for chills and fever.  HENT:  Negative for ear pain and sore throat.   Eyes:  Negative for pain and visual disturbance.  Respiratory:  Negative for cough and shortness of breath.   Cardiovascular:  Negative for chest pain and palpitations.  Gastrointestinal:  Positive for abdominal pain. Negative for diarrhea, nausea and vomiting.  Genitourinary:  Positive for flank pain. Negative for dysuria and hematuria.  Musculoskeletal:  Positive for back pain.  Negative for arthralgias.  Skin:  Negative for color change and rash.  Neurological:  Negative for seizures and syncope.  All other systems reviewed and are negative.   Physical Exam Updated Vital Signs BP 132/86   Pulse 97   Temp 97.9 F (36.6 C) (Oral)   Resp 18   Ht 1.575 m (5\' 2" )   Wt 117 kg   LMP 03/31/2023 (Exact Date)   SpO2 100%   BMI 47.18 kg/m  Physical Exam Vitals and nursing note reviewed.  Constitutional:      General: She is not in acute distress.    Appearance: Normal appearance. She is well-developed. She is not ill-appearing.  HENT:     Head: Normocephalic and atraumatic.  Eyes:     Extraocular Movements: Extraocular movements intact.     Conjunctiva/sclera: Conjunctivae normal.     Pupils: Pupils are equal, round, and reactive to light.  Cardiovascular:     Rate and Rhythm: Normal rate and regular rhythm.     Heart sounds: No murmur heard. Pulmonary:     Effort: Pulmonary effort is normal. No respiratory distress.     Breath sounds: Normal breath sounds.  Abdominal:     Palpations: Abdomen is soft.     Tenderness: There is no abdominal tenderness.  Musculoskeletal:        General: No swelling.     Cervical back: Normal range of motion and neck supple.  Skin:  General: Skin is warm and dry.     Capillary Refill: Capillary refill takes less than 2 seconds.  Neurological:     General: No focal deficit present.     Mental Status: She is alert and oriented to person, place, and time.  Psychiatric:        Mood and Affect: Mood normal.     ED Results / Procedures / Treatments   Labs (all labs ordered are listed, but only abnormal results are displayed) Labs Reviewed  URINALYSIS, ROUTINE W REFLEX MICROSCOPIC - Abnormal; Notable for the following components:      Result Value   APPearance HAZY (*)    Nitrite POSITIVE (*)    Leukocytes,Ua SMALL (*)    Bacteria, UA MANY (*)    All other components within normal limits  URINE CULTURE   PREGNANCY, URINE    EKG None  Radiology No results found.  Procedures Procedures    Medications Ordered in ED Medications - No data to display  ED Course/ Medical Decision Making/ A&P                                 Medical Decision Making Amount and/or Complexity of Data Reviewed Labs: ordered. Radiology: ordered.   Urine consistent with urinary tract infection.  But due to the flank pain will get CT renal just to rule out obstructive stone.  Urine sent for culture.  Since patient's been on amoxicillin we will probably treat her with Cipro.  Patient nontoxic no acute distress.  CT scan shows no evidence of any renal calculi.  There is evidence of some bilateral ovarian cyst.  Appear to be benign and functional.  No specific follow-up required.  Urine culture sent we will go ahead and treat for UTI with Cipro since she has been on amoxicillin.  Final Clinical Impression(s) / ED Diagnoses Final diagnoses:  Acute cystitis without hematuria    Rx / DC Orders ED Discharge Orders     None         Vanetta Mulders, MD 04/11/23 1610    Vanetta Mulders, MD 04/11/23 1001

## 2023-04-11 NOTE — ED Notes (Signed)
Dc instructions and scripts reviewed with pt no questions or concerns at this time. Will follow up as needed. Ambulated out of ED with steady gait.

## 2023-04-11 NOTE — ED Triage Notes (Signed)
Pt complains of lower back pain radiates around to lower abd. Pt states pain worse with standing. Also complains of cloudy urine. Denies any injury

## 2023-04-11 NOTE — Discharge Instructions (Signed)
CT scan negative for any kidney stones.  Take the new antibiotic Cipro as directed start taking it today.  He will take it for 5 days.  Would expect improvement over the next couple days.  Return for any new or worse symptoms like fever or persistent vomiting.

## 2023-04-13 LAB — URINE CULTURE: Culture: >=100000 — AB

## 2023-04-14 ENCOUNTER — Telehealth (HOSPITAL_BASED_OUTPATIENT_CLINIC_OR_DEPARTMENT_OTHER): Payer: Self-pay | Admitting: *Deleted

## 2023-04-14 NOTE — Telephone Encounter (Signed)
Post ED Visit - Positive Culture Follow-up  Culture report reviewed by antimicrobial stewardship pharmacist: [facility] []  Enzo Bi, Pharm.D. [x]  Celedonio Miyamoto, Pharm.D., BCPS AQ-ID []  Garvin Fila, Pharm.D., BCPS []  Georgina Pillion, Pharm.D., BCPS []  [place], [address].D., BCPS, AAHIVP []  Estella Husk, Pharm.D., BCPS, AAHIVP []  Lysle Pearl, PharmD, BCPS []  Phillips Climes, PharmD, BCPS []  Agapito Games, PharmD, BCPS []  Verlan Friends, PharmD []  Mervyn Gay, PharmD, BCPS []  Vinnie Level, PharmD  [facility] []  Len Childs, PharmD []  Greer Pickerel, PharmD []  Adalberto Cole, PharmD []  Perlie Gold, Rph []  Lonell Face) Jean Rosenthal, PharmD []  Earl Many, PharmD []  Junita Push, PharmD []  Dorna Leitz, PharmD []  Terrilee Files, PharmD []  Lynann Beaver, PharmD []  Keturah Barre, PharmD []  Loralee Pacas, PharmD []  Bernadene Person, PharmD   Positive urine culture Treated with Ciprofloxacin, organism sensitive to the same and no further patient follow-up is required at this time.  Bing Quarry 04/14/2023, 9:57 AM

## 2023-05-08 ENCOUNTER — Encounter (HOSPITAL_COMMUNITY): Payer: Self-pay

## 2023-05-08 ENCOUNTER — Ambulatory Visit (HOSPITAL_COMMUNITY): Payer: Medicaid Other

## 2023-05-08 NOTE — Therapy (Signed)
[facility] Outpatient Rehabilitation at [facility] [address] [place], [place], [address] Phone: [phone]   Fax:  [phone]  Patient Details  Name: Zoe Miller MRN: [id number] Date of Birth: Jun 10, 1996 Referring Provider:  No ref. provider found  Encounter Date: 05/08/2023  Called pt and left voicemail regarding no show for evaluation. Informed pt to call back to reschedule.    Nelida Meuse, PT 05/08/2023, 2:14 PM  [facility] Outpatient Rehabilitation at [facility] [address] [place], [place], [address] Phone: [phone]   Fax:  [phone]

## 2023-05-10 DIAGNOSIS — Z419 Encounter for procedure for purposes other than remedying health state, unspecified: Secondary | ICD-10-CM | POA: Diagnosis not present

## 2023-06-05 ENCOUNTER — Telehealth: Payer: Self-pay

## 2023-06-05 NOTE — Telephone Encounter (Signed)
Attempted wellness call for [facility] client and note she now has Medicaid. No longer listed at [facility] as PCP. No answer today left message that she may call for a list of providers taking new Medicaid patients if she were to need that and our contact numbers for the Main line to [facility] as well as my work number.  Will update her enrollment in [facility] and discontinue due to Medicaid as well as scann documents into fhases.   Francee Nodal RN [employer]

## 2023-06-09 DIAGNOSIS — Z419 Encounter for procedure for purposes other than remedying health state, unspecified: Secondary | ICD-10-CM | POA: Diagnosis not present

## 2023-07-10 DIAGNOSIS — Z419 Encounter for procedure for purposes other than remedying health state, unspecified: Secondary | ICD-10-CM | POA: Diagnosis not present

## 2023-08-09 DIAGNOSIS — Z419 Encounter for procedure for purposes other than remedying health state, unspecified: Secondary | ICD-10-CM | POA: Diagnosis not present

## 2023-09-09 DIAGNOSIS — Z419 Encounter for procedure for purposes other than remedying health state, unspecified: Secondary | ICD-10-CM | POA: Diagnosis not present

## 2023-10-10 DIAGNOSIS — Z419 Encounter for procedure for purposes other than remedying health state, unspecified: Secondary | ICD-10-CM | POA: Diagnosis not present
